# Patient Record
Sex: Female | Born: 1980 | Race: Black or African American | Hispanic: No | Marital: Married | Smoking: Former smoker
Health system: Southern US, Community
[De-identification: ages and names within clinical notes are randomized; demographics above are authoritative.]

## PROBLEM LIST (undated history)

## (undated) DIAGNOSIS — O09299 Supervision of pregnancy with other poor reproductive or obstetric history, unspecified trimester: Secondary | ICD-10-CM

## (undated) DIAGNOSIS — O009 Unspecified ectopic pregnancy without intrauterine pregnancy: Secondary | ICD-10-CM

## (undated) DIAGNOSIS — K219 Gastro-esophageal reflux disease without esophagitis: Secondary | ICD-10-CM

## (undated) DIAGNOSIS — D219 Benign neoplasm of connective and other soft tissue, unspecified: Secondary | ICD-10-CM

## (undated) DIAGNOSIS — B001 Herpesviral vesicular dermatitis: Secondary | ICD-10-CM

## (undated) DIAGNOSIS — O139 Gestational [pregnancy-induced] hypertension without significant proteinuria, unspecified trimester: Secondary | ICD-10-CM

## (undated) HISTORY — DX: Herpesviral vesicular dermatitis: B00.1

---

## 2001-04-28 ENCOUNTER — Other Ambulatory Visit: Admission: RE | Admit: 2001-04-28 | Discharge: 2001-04-28 | Payer: Self-pay | Admitting: Family Medicine

## 2003-06-14 ENCOUNTER — Other Ambulatory Visit: Admission: RE | Admit: 2003-06-14 | Discharge: 2003-06-14 | Payer: Self-pay | Admitting: Obstetrics and Gynecology

## 2006-03-30 HISTORY — PX: OTHER SURGICAL HISTORY: SHX169

## 2010-03-08 ENCOUNTER — Emergency Department (HOSPITAL_BASED_OUTPATIENT_CLINIC_OR_DEPARTMENT_OTHER)
Admission: EM | Admit: 2010-03-08 | Discharge: 2010-03-08 | Payer: Self-pay | Source: Home / Self Care | Admitting: Emergency Medicine

## 2010-03-10 ENCOUNTER — Inpatient Hospital Stay (HOSPITAL_COMMUNITY)
Admission: AD | Admit: 2010-03-10 | Discharge: 2010-03-10 | Payer: Self-pay | Source: Home / Self Care | Attending: Obstetrics & Gynecology | Admitting: Obstetrics & Gynecology

## 2010-03-14 ENCOUNTER — Inpatient Hospital Stay (HOSPITAL_COMMUNITY)
Admission: AD | Admit: 2010-03-14 | Discharge: 2010-03-14 | Payer: Self-pay | Source: Home / Self Care | Attending: Obstetrics & Gynecology | Admitting: Obstetrics & Gynecology

## 2010-03-28 ENCOUNTER — Inpatient Hospital Stay (HOSPITAL_COMMUNITY)
Admission: AD | Admit: 2010-03-28 | Discharge: 2010-03-28 | Payer: Self-pay | Source: Home / Self Care | Attending: Obstetrics and Gynecology | Admitting: Obstetrics and Gynecology

## 2010-04-02 ENCOUNTER — Ambulatory Visit: Admit: 2010-04-02 | Payer: Self-pay | Admitting: Obstetrics and Gynecology

## 2010-06-10 LAB — URINE MICROSCOPIC-ADD ON

## 2010-06-10 LAB — URINALYSIS, ROUTINE W REFLEX MICROSCOPIC
Bilirubin Urine: NEGATIVE
Glucose, UA: NEGATIVE mg/dL
Specific Gravity, Urine: 1.025 (ref 1.005–1.030)

## 2010-06-10 LAB — WET PREP, GENITAL: Trich, Wet Prep: NONE SEEN

## 2010-06-10 LAB — ABO/RH

## 2010-06-10 LAB — HCG, QUANTITATIVE, PREGNANCY
hCG, Beta Chain, Quant, S: 1255 m[IU]/mL — ABNORMAL HIGH (ref <5)
hCG, Beta Chain, Quant, S: 1895 m[IU]/mL — ABNORMAL HIGH (ref <5)

## 2011-02-05 ENCOUNTER — Ambulatory Visit (INDEPENDENT_AMBULATORY_CARE_PROVIDER_SITE_OTHER): Payer: BC Managed Care – PPO | Admitting: Internal Medicine

## 2011-02-05 ENCOUNTER — Encounter: Payer: Self-pay | Admitting: Internal Medicine

## 2011-02-05 ENCOUNTER — Other Ambulatory Visit (INDEPENDENT_AMBULATORY_CARE_PROVIDER_SITE_OTHER): Payer: BC Managed Care – PPO

## 2011-02-05 ENCOUNTER — Ambulatory Visit: Payer: Self-pay | Admitting: Internal Medicine

## 2011-02-05 VITALS — BP 124/78 | HR 78 | Temp 98.2°F | Resp 14 | Ht 66.5 in | Wt 159.8 lb

## 2011-02-05 DIAGNOSIS — Z0000 Encounter for general adult medical examination without abnormal findings: Secondary | ICD-10-CM

## 2011-02-05 LAB — COMPREHENSIVE METABOLIC PANEL
ALT: 18 U/L (ref 0–35)
Alkaline Phosphatase: 52 U/L (ref 39–117)
CO2: 27 mEq/L (ref 19–32)
Creatinine, Ser: 0.7 mg/dL (ref 0.4–1.2)
GFR: 134.89 mL/min (ref >60.00)
Glucose, Bld: 80 mg/dL (ref 70–99)
Total Bilirubin: 0.6 mg/dL (ref 0.3–1.2)

## 2011-02-05 LAB — CBC WITH DIFFERENTIAL/PLATELET
Basophils Absolute: 0.1 10*3/uL (ref 0.0–0.1)
HCT: 40.2 % (ref 36.0–46.0)
Hemoglobin: 13.4 g/dL (ref 12.0–15.0)
Lymphs Abs: 1.9 10*3/uL (ref 0.7–4.0)
MCHC: 33.4 g/dL (ref 30.0–36.0)
MCV: 93.3 fl (ref 78.0–100.0)
Monocytes Absolute: 0.4 10*3/uL (ref 0.1–1.0)
Monocytes Relative: 9.4 % (ref 3.0–12.0)
Neutro Abs: 2.3 10*3/uL (ref 1.4–7.7)
RDW: 13.6 % (ref 11.5–14.6)

## 2011-02-05 LAB — LIPID PANEL
Cholesterol: 168 mg/dL (ref 0–200)
Total CHOL/HDL Ratio: 3
Triglycerides: 57 mg/dL (ref 0.0–149.0)

## 2011-02-05 LAB — URINALYSIS, ROUTINE W REFLEX MICROSCOPIC
Leukocytes, UA: NEGATIVE
Specific Gravity, Urine: 1.015 (ref 1.000–1.030)
Urobilinogen, UA: 0.2 (ref 0.0–1.0)

## 2011-02-05 LAB — TSH: TSH: 0.6 u[IU]/mL (ref 0.35–5.50)

## 2011-02-05 NOTE — Assessment & Plan Note (Signed)
Exam done, labs ordered, pt ed material was given, she refused to get a Flu vaccine

## 2011-02-05 NOTE — Progress Notes (Signed)
  Subjective:    Patient ID: Shannon Davenport, female    DOB: 08-09-1980, 30 y.o.   MRN: [id number]  HPI New to me for a complete physical, she offers no complaints.   Review of Systems  Constitutional: Negative.   HENT: Negative.   Eyes: Negative.   Respiratory: Negative.   Cardiovascular: Negative.   Gastrointestinal: Negative.   Genitourinary: Negative.   Musculoskeletal: Negative.   Skin: Negative.   Neurological: Negative.   Hematological: Negative.   Psychiatric/Behavioral: Negative.        Objective:   Physical Exam  Vitals reviewed. Constitutional: She is oriented to person, place, and time. She appears well-developed and well-nourished. No distress.  HENT:  Head: Normocephalic and atraumatic.  Mouth/Throat: Oropharynx is clear and moist. No oropharyngeal exudate.  Eyes: Conjunctivae and EOM are normal. Pupils are equal, round, and reactive to light. Right eye exhibits no discharge. Left eye exhibits no discharge. No scleral icterus.  Neck: Normal range of motion. Neck supple. No JVD present. No tracheal deviation present. No thyromegaly present.  Cardiovascular: Normal rate, regular rhythm, normal heart sounds and intact distal pulses.  Exam reveals no gallop and no friction rub.   No murmur heard. Pulmonary/Chest: Effort normal and breath sounds normal. No stridor. No respiratory distress. She has no wheezes. She has no rales. She exhibits no tenderness.  Abdominal: Soft. Bowel sounds are normal. She exhibits no distension and no mass. There is no tenderness. There is no rebound and no guarding.  Musculoskeletal: Normal range of motion. She exhibits no edema and no tenderness.  Lymphadenopathy:    She has no cervical adenopathy.  Neurological: She is oriented to person, place, and time.  Skin: Skin is warm and dry. No rash noted. She is not diaphoretic. No erythema. No pallor.  Psychiatric: She has a normal mood and affect. Her behavior is normal. Judgment and  thought content normal.          Assessment & Plan:

## 2011-02-05 NOTE — Patient Instructions (Signed)

## 2011-02-09 ENCOUNTER — Encounter: Payer: Self-pay | Admitting: Internal Medicine

## 2011-02-18 ENCOUNTER — Encounter: Payer: Self-pay | Admitting: Internal Medicine

## 2011-02-18 ENCOUNTER — Ambulatory Visit (INDEPENDENT_AMBULATORY_CARE_PROVIDER_SITE_OTHER): Payer: BC Managed Care – PPO | Admitting: Internal Medicine

## 2011-02-18 VITALS — BP 120/80 | HR 76 | Temp 98.8°F | Ht 66.5 in | Wt 161.8 lb

## 2011-02-18 DIAGNOSIS — B009 Herpesviral infection, unspecified: Secondary | ICD-10-CM

## 2011-02-18 DIAGNOSIS — B001 Herpesviral vesicular dermatitis: Secondary | ICD-10-CM

## 2011-02-18 DIAGNOSIS — J209 Acute bronchitis, unspecified: Secondary | ICD-10-CM | POA: Insufficient documentation

## 2011-02-18 HISTORY — DX: Herpesviral vesicular dermatitis: B00.1

## 2011-02-18 MED ORDER — PSEUDOEPH-CHLORPHEN-HYDROCOD 60-4-5 MG/5ML PO SOLN: 5.0000 mL | Freq: Four times a day (QID) | ORAL | Status: DC | PRN

## 2011-02-18 MED ORDER — VALACYCLOVIR HCL 500 MG PO TABS: 500.0000 mg | ORAL_TABLET | Freq: Three times a day (TID) | ORAL | Status: DC

## 2011-02-18 MED ORDER — AZITHROMYCIN 500 MG PO TABS: 500.0000 mg | ORAL_TABLET | Freq: Every day | ORAL | Status: AC

## 2011-02-18 NOTE — Assessment & Plan Note (Signed)
Start zpak for the infection and zutripro for symptom relief

## 2011-02-18 NOTE — Assessment & Plan Note (Signed)
Start valtrex for the infection

## 2011-02-18 NOTE — Progress Notes (Signed)
  Subjective:    Patient ID: Shannon Davenport, female    DOB: April 09, 1980, 30 y.o.   MRN: [id number]  Cough This is a new problem. The current episode started in the past 7 days. The problem has been gradually worsening. The problem occurs every few hours. The cough is productive of purulent sputum. Associated symptoms include chills and a sore throat. Pertinent negatives include no chest pain, ear congestion, ear pain, eye redness, fever, headaches, heartburn, hemoptysis, myalgias, nasal congestion, postnasal drip, rash, rhinorrhea, shortness of breath, sweats, weight loss or wheezing. The symptoms are aggravated by nothing. She has tried nothing for the symptoms.  Sore Throat  Associated symptoms include coughing. Pertinent negatives include no abdominal pain, congestion, diarrhea, drooling, ear pain, headaches, shortness of breath, stridor, trouble swallowing or vomiting.      Review of Systems  Constitutional: Positive for chills. Negative for fever, weight loss, diaphoresis, activity change, appetite change, fatigue and unexpected weight change.  HENT: Positive for sore throat and mouth sores. Negative for ear pain, congestion, rhinorrhea, sneezing, drooling, trouble swallowing, voice change and postnasal drip.   Eyes: Negative for photophobia, pain, discharge, redness, itching and visual disturbance.  Respiratory: Positive for cough. Negative for hemoptysis, shortness of breath, wheezing and stridor.   Cardiovascular: Negative for chest pain, palpitations and leg swelling.  Gastrointestinal: Negative for heartburn, nausea, vomiting, abdominal pain, diarrhea, constipation and blood in stool.  Genitourinary: Negative for dysuria, urgency, frequency, hematuria, flank pain, decreased urine volume, enuresis, difficulty urinating and dyspareunia.  Musculoskeletal: Negative for myalgias, back pain, joint swelling, arthralgias and gait problem.  Skin: Negative for color change, pallor, rash and  wound.  Neurological: Negative for headaches.  Hematological: Negative for adenopathy. Does not bruise/bleed easily.  Psychiatric/Behavioral: Negative.        Objective:   Physical Exam  Vitals reviewed. Constitutional: She is oriented to person, place, and time. She appears well-developed and well-nourished. No distress.  HENT:  Head: Normocephalic and atraumatic. No trismus in the jaw.  Right Ear: Hearing, tympanic membrane, external ear and ear canal normal.  Left Ear: Hearing, tympanic membrane, external ear and ear canal normal.  Mouth/Throat: Oropharynx is clear and moist. Mucous membranes are not pale, not dry and not cyanotic. No uvula swelling. No oropharyngeal exudate, posterior oropharyngeal edema, posterior oropharyngeal erythema or tonsillar abscesses.    Eyes: Conjunctivae are normal. Right eye exhibits no discharge. Left eye exhibits no discharge. No scleral icterus.  Neck: Normal range of motion. Neck supple. No JVD present. No tracheal deviation present. No thyromegaly present.  Cardiovascular: Normal rate, regular rhythm, normal heart sounds and intact distal pulses.  Exam reveals no gallop and no friction rub.   No murmur heard. Pulmonary/Chest: Effort normal and breath sounds normal. No stridor. No respiratory distress. She has no wheezes. She has no rales. She exhibits no tenderness.  Abdominal: Soft. Bowel sounds are normal. She exhibits no distension and no mass. There is no tenderness. There is no rebound and no guarding.  Musculoskeletal: Normal range of motion. She exhibits no edema and no tenderness.  Lymphadenopathy:    She has no cervical adenopathy.  Neurological: She is oriented to person, place, and time.  Skin: Skin is warm and dry. No rash noted. She is not diaphoretic. There is erythema. No pallor.  Psychiatric: She has a normal mood and affect. Her behavior is normal. Judgment and thought content normal.          Assessment & Plan:

## 2011-02-18 NOTE — Patient Instructions (Signed)
Fever Blisters, Herpes Simplex Herpes simplex is a virus. This virus causes fever blisters or cold sores. Fever blisters are small sores on the lips, gums, or roof of the mouth. People often get infected with this herpes virus but do not have any symptoms. The blisters may break out when a person is:  Tired.   Under stress.   Suffering from another infection (such as a cold).   Exposed to sunlight.  The blisters usually heal within 1 week. The virus can be easily passed to other people and to other parts of the body, such as the eyes and sex organs. CAUSES  A virus, herpes simplex, is the cause of fever blisters. This virus can be passed (transmitted) from person to person and is therefore contagious. There are 2 types of herpes simplex virus. Type 1 usually causes oral herpes or fever blisters. Type 2 usually causes genital herpes. Both viruses do have the potential to cause oral and genital infections. However, the type 1 virus causes more than 90% of recurrent fever blister outbreaks.  Herpes simplex virus is highly contagious when fever blisters are present. Close contact, including kissing, can spread the virus. Children often become infected by contact with others who have fever blisters. A child can spread the virus by rubbing the cold sore and touching other children or when other children touch clothing, wipes, or toys contaminated by an infected child with the virus. In adults, about 10% of oral herpes infections are from oral-genital sex with a person who has active genital herpes (type 2).  Type 1 herpes infection is very common, eventually occurring in up to 8 out of 10 otherwise healthy people. Most people become infected before they are 30 years old. The virus usually infects the lips, throat, or mouth. Initial infection in children can be extensive with many lesions throughout the mouth. In adults, the first infection may cause no symptoms. Some adults may develop many fluid-filled  blisters inside and outside the mouth 3 to 5 days after they are initially infected but severe infection is uncommon. Fever, swollen neck glands, and general aches may occur but this is also uncommon. The blisters tend to come together and then collapse. When on the lip, a yellowish crust forms over the sores. Healing of the area without scarring typically occurs within 2 weeks. Once a person is infected, the herpes virus permanently remains alive in the body within a nerve near the cheekbone. It then stays inactive at this site, only to sometimes travel down the nerve to the skin. This causes a recurrence of fever blisters. Recurrent blisters usually break out at the outside edge of the lip or edge of the nostril. Recurrent fever blisters may occasionally occur on the chin, cheeks, or inside the mouth. Recurrent fever blister attacks are usually not as painful and not as numerous as the first infection. Recurrences are less frequent after age 35. Many people who have recurring fever blisters feel itching, tingling, or burning at the lip border. This can occur hours or a couple days before the blister appears.  Factors which weaken the body's immune system may trigger an outbreak or recurrence of herpes. These include some drugs (such as steroids), emotional stress, fever, illness, sleep deprivation, and other injuries. Sunlight may also trigger an outbreak. Many women have recurrences only during their menstrual period.  TREATMENT There is no cure for fever blisters. There is no vaccine for herpes simplex virus.  Certain medicines can relieve some of the pain   and discomfort of the sores or promote more rapid healing. These include ointments that numb the blisters and medicines that control bacterial infections (antibiotics). A number of drugs active against herpes viruses (antivirals), either applied locally as a gel or cream, or taken in pill form, may promote healing by keeping the virus from multiplying  and infecting more local tissue.   Keep fever blisters clean and dry. This helps to prevent bacterial invasion of the virally infected tissues.   Eat a soft, bland diet to avoid irritating the sores.   Be careful not to touch the sores and spread the virus to new sites, such as:   Other areas of the face.   Eyes.   Genitals.   Make sure you do not infect others. Avoid kissing people when a fever blister is present. Avoid touching the sores and then touching others.   Sunscreen on the lips can prevent recurrences if outbreaks are triggered by sunlight. The sunscreen should be put on before going outside and reapplied often while in the sun.   Avoid stress if this seems to cause outbreaks.  HOME CARE INSTRUCTIONS   Only take over-the-counter or prescription medicines for pain, discomfort, or fever as directed by your caregiver. Do not use aspirin.   Do not touch the blisters or pick the scabs. Wash your hands often. Do not touch your eyes without washing your hands first.   Avoid close contact with other people, especially kissing, until blisters heal.   Hot, cold, or salty foods may hurt your mouth. Use a straw to drink. Eating a well-balanced diet will help healing.  SEEK MEDICAL CARE IF:   Your eye feels irritated, painful, or you feel like you have something in your eye.   You develop a fever, feel achy, or see pus instead of clear fluid in the sores. These are signs of a bacterial infection.   You get blisters on your genitals.   You develop new, unexplained symptoms.  MAKE SURE YOU:   Understand these instructions.   Will watch your condition.   Will get help right away if you are not doing well or get worse.  Document Released: 03/16/2005 Document Revised: 11/26/2010 Document Reviewed: 07/21/2007 [facility] Patient Information 2012 [place], [place].Acute Bronchitis You have acute bronchitis. This means you have a chest cold. The airways in your lungs are red and sore  (inflamed). Acute means it is sudden onset.  CAUSES Bronchitis is most often caused by the same virus that causes a cold. SYMPTOMS   Body aches.   Chest congestion.   Chills.   Cough.   Fever.   Shortness of breath.   Sore throat.  TREATMENT  Acute bronchitis is usually treated with rest, fluids, and medicines for relief of fever or cough. Most symptoms should go away after a few days or a week. Increased fluids may help thin your secretions and will prevent dehydration. Your caregiver may give you an inhaler to improve your symptoms. The inhaler reduces shortness of breath and helps control cough. You can take over-the-counter pain relievers or cough medicine to decrease coughing, pain, or fever. A cool-air vaporizer may help thin bronchial secretions and make it easier to clear your chest. Antibiotics are usually not needed but can be prescribed if you smoke, are seriously ill, have chronic lung problems, are elderly, or you are at higher risk for developing complications.Allergies and asthma can make bronchitis worse. Repeated episodes of bronchitis may cause longstanding lung problems. Avoid smoking and secondhand  smoke.Exposure to cigarette smoke or irritating chemicals will make bronchitis worse. If you are a cigarette smoker, consider using nicotine gum or skin patches to help control withdrawal symptoms. Quitting smoking will help your lungs heal faster. Recovery from bronchitis is often slow, but you should start feeling better after 2 to 3 days. Cough from bronchitis frequently lasts for 3 to 4 weeks. To prevent another bout of acute bronchitis:  Quit smoking.   Wash your hands frequently to get rid of viruses or use a hand sanitizer.   Avoid other people with cold or virus symptoms.   Try not to touch your hands to your mouth, nose, or eyes.  SEEK IMMEDIATE MEDICAL CARE IF:  You develop increased fever, chills, or chest pain.   You have severe shortness of breath or  bloody sputum.   You develop dehydration, fainting, repeated vomiting, or a severe headache.   You have no improvement after 1 week of treatment or you get worse.  MAKE SURE YOU:   Understand these instructions.   Will watch your condition.   Will get help right away if you are not doing well or get worse.  Document Released: 04/23/2004 Document Revised: 11/26/2010 Document Reviewed: 07/09/2010 [facility] Patient Information 2012 [place], [place].

## 2011-05-14 ENCOUNTER — Ambulatory Visit: Payer: BC Managed Care – PPO | Admitting: Internal Medicine

## 2011-05-14 DIAGNOSIS — Z0289 Encounter for other administrative examinations: Secondary | ICD-10-CM

## 2011-05-20 ENCOUNTER — Encounter: Payer: Self-pay | Admitting: Internal Medicine

## 2011-05-20 ENCOUNTER — Ambulatory Visit (INDEPENDENT_AMBULATORY_CARE_PROVIDER_SITE_OTHER): Payer: BC Managed Care – PPO | Admitting: Internal Medicine

## 2011-05-20 VITALS — BP 130/80 | HR 80 | Temp 97.4°F | Ht 66.5 in | Wt 162.0 lb

## 2011-05-20 DIAGNOSIS — J019 Acute sinusitis, unspecified: Secondary | ICD-10-CM

## 2011-05-20 DIAGNOSIS — J209 Acute bronchitis, unspecified: Secondary | ICD-10-CM

## 2011-05-20 MED ORDER — LEVOFLOXACIN 250 MG PO TABS: 250.0000 mg | ORAL_TABLET | Freq: Every day | ORAL | Status: AC

## 2011-05-20 MED ORDER — PSEUDOEPH-CHLORPHEN-HYDROCOD 60-4-5 MG/5ML PO SOLN: 5.0000 mL | Freq: Four times a day (QID) | ORAL | Status: DC | PRN

## 2011-05-20 NOTE — Progress Notes (Signed)
  Subjective:    Patient ID: Shannon Davenport, female    DOB: 05/02/1980, 30 y.o.   MRN: [id number]  HPI   Here with 2 wks acute onset fever, facial pain, pressure, general weakness and malaise, and greenish d/c, with slight ST, but little to no cough and Pt denies chest pain, increased sob or doe, wheezing, orthopnea, PND, increased LE swelling, palpitations, dizziness or syncope.   Pt denies polydipsia, polyuria. Not pregnant - home preg test neg 3 days ago. Past Medical History  Diagnosis Date  . 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  reports that she has been smoking Cigarettes.  She has a 1.75 pack-year smoking history. She has never used smokeless tobacco. She reports that she drinks about 3 ounces of alcohol per week. She reports that she does not use illicit drugs. family history includes Heart disease in her mother; Hyperlipidemia in her mother; and Hypertension in her mother.  There is no history of Cancer. No Known Allergies Current Outpatient Prescriptions on File Prior to Visit  Medication Sig Dispense Refill  . Multiple Vitamin (MULTIVITAMIN) tablet Take 1 tablet by mouth daily.         Review of Systems All otherwise neg per pt     Objective:   Physical Exam BP 130/80  Pulse 80  Temp(Src) 97.4 F (36.3 C) (Oral)  Ht 5' 6.5" (1.689 m)  Wt 162 lb (73.483 kg)  BMI 25.76 kg/m2  SpO2 96% Physical Exam  VS noted, mild ill Constitutional: Pt appears well-developed and well-nourished.  HENT: Head: Normocephalic.  Right Ear: External ear normal.  Left Ear: External ear normal.  Bilat tm's mild erythema.  Sinus tender bilat right max > left.  Pharynx mild erythema Eyes: Conjunctivae and EOM are normal. Pupils are equal, round, and reactive to light.  Neck: Normal range of motion. Neck supple.  Cardiovascular: Normal rate and regular rhythm.   Pulmonary/Chest: Effort normal and breath sounds normal.  Neurological: Pt is alert. No cranial  nerve deficit.     Assessment & Plan:

## 2011-05-20 NOTE — Assessment & Plan Note (Signed)
Mild to mod, for antibx course,  to f/u any worsening symptoms or concerns 

## 2011-05-20 NOTE — Patient Instructions (Addendum)
Take all new medications as prescribed Continue all other medications as before You can also take  Mucinex (or it's generic off brand) for congestion  

## 2012-02-02 ENCOUNTER — Ambulatory Visit (INDEPENDENT_AMBULATORY_CARE_PROVIDER_SITE_OTHER): Payer: BC Managed Care – PPO | Admitting: Internal Medicine

## 2012-02-02 ENCOUNTER — Encounter: Payer: Self-pay | Admitting: Internal Medicine

## 2012-02-02 VITALS — BP 130/82 | HR 83 | Temp 98.0°F | Resp 16 | Ht 67.0 in | Wt 162.5 lb

## 2012-02-02 DIAGNOSIS — J4530 Mild persistent asthma, uncomplicated: Secondary | ICD-10-CM | POA: Insufficient documentation

## 2012-02-02 DIAGNOSIS — J209 Acute bronchitis, unspecified: Secondary | ICD-10-CM

## 2012-02-02 DIAGNOSIS — J309 Allergic rhinitis, unspecified: Secondary | ICD-10-CM | POA: Insufficient documentation

## 2012-02-02 DIAGNOSIS — B001 Herpesviral vesicular dermatitis: Secondary | ICD-10-CM

## 2012-02-02 DIAGNOSIS — J45909 Unspecified asthma, uncomplicated: Secondary | ICD-10-CM

## 2012-02-02 MED ORDER — MOMETASONE FUROATE 50 MCG/ACT NA SUSP: 2.0000 | Freq: Every day | NASAL | Status: DC

## 2012-02-02 MED ORDER — MOMETASONE FURO-FORMOTEROL FUM 200-5 MCG/ACT IN AERO: 2.0000 | INHALATION_SPRAY | Freq: Two times a day (BID) | RESPIRATORY_TRACT | Status: DC

## 2012-02-02 MED ORDER — VALACYCLOVIR HCL 500 MG PO TABS: 500.0000 mg | ORAL_TABLET | Freq: Three times a day (TID) | ORAL | Status: DC

## 2012-02-02 MED ORDER — PSEUDOEPH-CHLORPHEN-HYDROCOD 60-4-5 MG/5ML PO SOLN: 5.0000 mL | Freq: Four times a day (QID) | ORAL | Status: DC | PRN

## 2012-02-02 NOTE — Progress Notes (Signed)
Subjective:    Patient ID: Shannon Davenport, female    DOB: Mar 11, 1981, 31 y.o.   MRN: [id number]  Cough This is a chronic problem. The current episode started more than 1 year ago. The problem has been unchanged. The problem occurs every few hours. The cough is non-productive. Associated symptoms include nasal congestion, postnasal drip and rhinorrhea. Pertinent negatives include no chest pain, chills, ear congestion, ear pain, fever, headaches, heartburn, hemoptysis, myalgias, rash, sore throat, shortness of breath, sweats, weight loss or wheezing. The symptoms are aggravated by cold air and dust. She has tried prescription cough suppressant for the symptoms. The treatment provided significant relief. Her past medical history is significant for environmental allergies. There is no history of asthma, bronchitis or pneumonia.      Review of Systems  Constitutional: Negative for fever, chills, weight loss, diaphoresis, activity change, appetite change, fatigue and unexpected weight change.  HENT: Positive for congestion, rhinorrhea, sneezing and postnasal drip. Negative for ear pain, nosebleeds, sore throat, facial swelling, trouble swallowing, voice change and sinus pressure.   Eyes: Negative.   Respiratory: Positive for cough. Negative for apnea, hemoptysis, choking, chest tightness, shortness of breath, wheezing and stridor.   Cardiovascular: Negative for chest pain, palpitations and leg swelling.  Gastrointestinal: Negative for heartburn, nausea, vomiting, abdominal pain, diarrhea and constipation.  Genitourinary: Negative.   Musculoskeletal: Negative for myalgias, back pain, joint swelling, arthralgias and gait problem.  Skin: Negative for color change, pallor, rash and wound.  Neurological: Negative.  Negative for headaches.  Hematological: Positive for environmental allergies. Negative for adenopathy. Does not bruise/bleed easily.  Psychiatric/Behavioral: Negative.        Objective:     Physical Exam  Vitals reviewed. Constitutional: She is oriented to person, place, and time. She appears well-developed and well-nourished.  Non-toxic appearance. She does not have a sickly appearance. She does not appear ill. No distress.  HENT:  Head: Normocephalic and atraumatic. No trismus in the jaw.  Right Ear: Hearing, tympanic membrane, external ear and ear canal normal.  Left Ear: Hearing, tympanic membrane, external ear and ear canal normal.  Nose: Mucosal edema present. No rhinorrhea, sinus tenderness, nasal deformity or septal deviation. No epistaxis.  No foreign bodies. Right sinus exhibits no maxillary sinus tenderness and no frontal sinus tenderness. Left sinus exhibits no maxillary sinus tenderness and no frontal sinus tenderness.  Mouth/Throat: Oropharynx is clear and moist. Mucous membranes are not pale, not dry and not cyanotic. No oral lesions. No uvula swelling. No oropharyngeal exudate, posterior oropharyngeal edema, posterior oropharyngeal erythema or tonsillar abscesses.  Eyes: Conjunctivae normal are normal. Right eye exhibits no discharge. Left eye exhibits no discharge. No scleral icterus.  Neck: Normal range of motion. Neck supple. No JVD present. No tracheal deviation present. No thyromegaly present.  Cardiovascular: Normal rate, regular rhythm, normal heart sounds and intact distal pulses.  Exam reveals no gallop and no friction rub.   No murmur heard. Pulmonary/Chest: Effort normal and breath sounds normal. No accessory muscle usage or stridor. Not tachypneic. No respiratory distress. She has no decreased breath sounds. She has no wheezes. She has no rhonchi. She has no rales. She exhibits no tenderness.  Abdominal: Soft. Bowel sounds are normal. She exhibits no distension and no mass. There is no tenderness. There is no rebound and no guarding.  Musculoskeletal: Normal range of motion. She exhibits no edema and no tenderness.  Lymphadenopathy:    She has no cervical  adenopathy.  Neurological: She is oriented to person,  place, and time.  Skin: Skin is warm and dry. No rash noted. She is not diaphoretic. No erythema. No pallor.  Psychiatric: She has a normal mood and affect. Her behavior is normal. Judgment and thought content normal.      Lab Results  Component Value Date   WBC 4.8 02/05/2011   HGB 13.4 02/05/2011   HCT 40.2 02/05/2011   PLT 221.0 02/05/2011   GLUCOSE 80 02/05/2011   CHOL 168 02/05/2011   TRIG 57.0 02/05/2011   HDL 61.30 02/05/2011   LDLCALC 95 02/05/2011   ALT 18 02/05/2011   AST 26 02/05/2011   NA 138 02/05/2011   K 3.8 02/05/2011   CL 103 02/05/2011   CREATININE 0.7 02/05/2011   BUN 15 02/05/2011   CO2 27 02/05/2011   TSH 0.60 02/05/2011      Assessment & Plan:

## 2012-02-02 NOTE — Patient Instructions (Signed)

## 2012-02-03 ENCOUNTER — Encounter: Payer: Self-pay | Admitting: Internal Medicine

## 2012-02-03 NOTE — Assessment & Plan Note (Signed)
Start nasonex ns 

## 2012-02-03 NOTE — Assessment & Plan Note (Addendum)
Start dulera and use cough suppressant as needed

## 2012-03-07 ENCOUNTER — Ambulatory Visit: Payer: BC Managed Care – PPO | Admitting: Internal Medicine

## 2012-03-07 DIAGNOSIS — Z0289 Encounter for other administrative examinations: Secondary | ICD-10-CM

## 2012-05-11 ENCOUNTER — Ambulatory Visit: Payer: BC Managed Care – PPO | Admitting: Internal Medicine

## 2012-05-13 ENCOUNTER — Ambulatory Visit: Payer: BC Managed Care – PPO | Admitting: Internal Medicine

## 2012-05-27 ENCOUNTER — Ambulatory Visit (INDEPENDENT_AMBULATORY_CARE_PROVIDER_SITE_OTHER): Payer: BC Managed Care – PPO | Admitting: Internal Medicine

## 2012-05-27 ENCOUNTER — Encounter: Payer: Self-pay | Admitting: Internal Medicine

## 2012-05-27 VITALS — BP 130/82 | HR 77 | Temp 98.1°F | Ht 67.0 in | Wt 164.2 lb

## 2012-05-27 DIAGNOSIS — B001 Herpesviral vesicular dermatitis: Secondary | ICD-10-CM

## 2012-05-27 DIAGNOSIS — B009 Herpesviral infection, unspecified: Secondary | ICD-10-CM

## 2012-05-27 DIAGNOSIS — J309 Allergic rhinitis, unspecified: Secondary | ICD-10-CM

## 2012-05-27 DIAGNOSIS — M7710 Lateral epicondylitis, unspecified elbow: Secondary | ICD-10-CM

## 2012-05-27 MED ORDER — VALACYCLOVIR HCL 500 MG PO TABS: 500.0000 mg | ORAL_TABLET | Freq: Three times a day (TID) | ORAL | Status: AC

## 2012-05-27 MED ORDER — PSEUDOEPH-CHLORPHEN-HYDROCOD 60-4-5 MG/5ML PO SOLN: 5.0000 mL | Freq: Four times a day (QID) | ORAL | Status: DC | PRN

## 2012-05-27 NOTE — Progress Notes (Signed)
Subjective:    Patient ID: Shannon Davenport, female    DOB: May 19, 1980, 32 y.o.   MRN: [id number]  HPI  Pt presents to the clinic today with c/o elbow pain. This started about 1 week ago.Both of her elbows hurt. She denies having an injury to them. She does do push up's daily. She has been taking Advil and icing them which helps relieve the pain. She does deny any swelling of the joints. She has never had problems with her elbows in the past. Additionally, she does c/o of a chronic cough. She has seen Dr.Jones for this in the past who prescribed Dulera, Nasonex and a cough syrup for her. She does not want to take the inhaler or nasal spray. She does not think she has allergies or asthma. She would like a refill of the cough syrup. Pt would also like a refill of her Valtrex. She is not having any symptoms at this time.  Review of Systems      Past Medical History  Diagnosis Date  . 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 mometasone (NASONEX) 50 MCG/ACT nasal spray Place 2 sprays into the nose daily.  17 g  12  . mometasone-formoterol (DULERA) 200-5 MCG/ACT AERO Inhale 2 puffs into the lungs 2 (two) times daily.  13 g  11  . Multiple Vitamin (MULTIVITAMIN) tablet Take 1 tablet by mouth daily.        . Pseudoeph-Chlorphen-Hydrocod (ZUTRIPRO) 60-4-5 MG/5ML SOLN Take 5 mLs by mouth 4 (four) times daily as needed.  240 mL  0   No current facility-administered medications for this visit.    No Known Allergies  Family History  Problem Relation Age of Onset  . Heart disease Mother   . Hyperlipidemia Mother   . Hypertension Mother   . Cancer Neg Hx     History   Social History  . Marital Status: Married    Spouse Name: N/A    Number of Children: N/A  . Years of Education: N/A   Occupational History  . Not on file.   Social History Main Topics  . Smoking status: Current Some Day Smoker -- 0.25 packs/day for 7  years    Types: Cigarettes  . Smokeless tobacco: Never Used  . Alcohol Use: 3.0 oz/week    5 Glasses of wine per week  . Drug Use: No  . Sexually Active: Yes    Birth Control/ Protection: Condom   Other Topics Concern  . Not on file   Social History Narrative  . No narrative on file     Constitutional: Denies fever, malaise, fatigue, headache or abrupt weight changes.  HEENT: Pt reports post nasal drip. Denies eye pain, eye redness, ear pain, ringing in the ears, wax buildup, runny nose, nasal congestion, bloody nose, or sore throat. Respiratory: Pt reports cough. Denies difficulty breathing, shortness of breath, cough or sputum production.   Musculoskeletal: Pt reports bilateral elbow pain. Denies decrease in range of motion, difficulty with gait, muscle pain or joint pain and swelling.    No other specific complaints in a complete review of systems (except as listed in HPI above). .  Objective:   Physical Exam   BP 130/82  Pulse 77  Temp(Src) 98.1 F (36.7 C) (Oral)  Ht 5\' 7"  (1.702 m)  Wt 164 lb 3.2 oz (74.481 kg)  BMI 25.71 kg/m2  SpO2 97%  LMP 04/16/2012 Wt Readings from Last 3  Encounters:  05/27/12 164 lb 3.2 oz (74.481 kg)  02/02/12 162 lb 8 oz (73.71 kg)  05/20/11 162 lb (73.483 kg)    General: Appears her stated age, well developed, well nourished in NAD. HEENT: Head: normal shape and size; Eyes: sclera white, no icterus, conjunctiva pink, PERRLA and EOMs intact; Ears: Tm's gray and intact, normal light reflex; Nose: mucosa pink and moist, septum midline; Throat/Mouth: Teeth present, mucosa pink and moist, + PND, no exudate, lesions or ulcerations noted.  Cardiovascular: Normal rate and rhythm. S1,S2 noted.  No murmur, rubs or gallops noted. No JVD or BLE edema. No carotid bruits noted. Pulmonary/Chest: Normal effort and positive vesicular breath sounds. No respiratory distress. No wheezes, rales or ronchi noted.  Musculoskeletal: Pinpoint tenderness over the  lateral epicondyles. Normal range of motion. No signs of joint swelling. No difficulty with gait.       Assessment & Plan:   Tennis elbow, bilateral, new onset, no additional workup required:  I would stop doing pushups for the next week Continue the Advil and Ice Information given on RICE for injuries  Allergic Rhinitis, new, no additional workup required:  Refilled cough syrup Pt would benefit from daily Zyrtec- pt declined at this time  Herpes Simplex Labialis:  Refilled Valtrex today  RTC as needed or if symptoms persist

## 2012-05-27 NOTE — Patient Instructions (Signed)
Lateral Epicondylitis (Tennis Elbow) with Rehab Lateral epicondylitis involves inflammation and pain around the outer portion of the elbow. The pain is caused by inflammation of the tendons in the forearm that bring back (extend) the wrist. Lateral epicondylittis is also called tennis elbow, because it is very common in tennis players. However, it may occur in any individual who extends the wrist repetitively. If lateral epicondylitis is left untreated, it may become a chronic problem. SYMPTOMS   Pain, tenderness, and inflammation on the outer (lateral) side of the elbow.  Pain or weakness with gripping activities.  Pain that increases with wrist twisting motions (playing tennis, using a screwdriver, opening a door or a jar).  Pain with lifting objects, including a coffee cup. CAUSES  Lateral epicondylitis is caused by inflammation of the tendons that extend the wrist. Causes of injury may include:  Repetitive stress and strain on the muscles and tendons that extend the wrist.  Sudden change in activity level or intensity.  Incorrect grip in racquet sports.  Incorrect grip size of racquet (often too large).  Incorrect hitting position or technique (usually backhand, leading with the elbow).  Using a racket that is too heavy. RISK INCREASES WITH:  Sports or occupations that require repetitive and/or strenuous forearm and wrist movements (tennis, squash, racquetball, carpentry).  Poor wrist and forearm strength and flexibility.  Failure to warm up properly before activity.  Resuming activity before healing, rehabilitation, and conditioning are complete. PREVENTION   Warm up and stretch properly before activity.  Maintain physical fitness:  Strength, flexibility, and endurance.  Cardiovascular fitness.  Wear and use properly fitted equipment.  Learn and use proper technique and have a coach correct improper technique.  Wear a tennis elbow (counterforce) brace. PROGNOSIS   The course of this condition depends on the degree of the injury. If treated properly, acute cases (symptoms lasting less than 4 weeks) are often resolved in 2 to 6 weeks. Chronic (longer lasting cases) often resolve in 3 to 6 months, but may require physical therapy. RELATED COMPLICATIONS   Frequently recurring symptoms, resulting in a chronic problem. Properly treating the problem the first time decreases frequency of recurrence.  Chronic inflammation, scarring tendon degeneration, and partial tendon tear, requiring surgery.  Delayed healing or resolution of symptoms. TREATMENT  Treatment first involves the use of ice and medicine, to reduce pain and inflammation. Strengthening and stretching exercises may help reduce discomfort, if performed regularly. These exercises may be performed at home, if the condition is an acute injury. Chronic cases may require a referral to a physical therapist for evaluation and treatment. Your caregiver may advise a corticosteroid injection, to help reduce inflammation. Rarely, surgery is needed. MEDICATION  If pain medicine is needed, nonsteroidal anti-inflammatory medicines (aspirin and ibuprofen), or other minor pain relievers (acetaminophen), are often advised.  Do not take pain medicine for 7 days before surgery.  Prescription pain relievers may be given, if your caregiver thinks they are needed. Use only as directed and only as much as you need.  Corticosteroid injections may be recommended. These injections should be reserved only for the most severe cases, because they can only be given a certain number of times. HEAT AND COLD  Cold treatment (icing) should be applied for 10 to 15 minutes every 2 to 3 hours for inflammation and pain, and immediately after activity that aggravates your symptoms. Use ice packs or an ice massage.  Heat treatment may be used before performing stretching and strengthening activities prescribed by your   caregiver, physical  therapist, or athletic trainer. Use a heat pack or a warm water soak. SEEK MEDICAL CARE IF: Symptoms get worse or do not improve in 2 weeks, despite treatment. EXERCISES  RANGE OF MOTION (ROM) AND STRETCHING EXERCISES - Epicondylitis, Lateral (Tennis Elbow) These exercises may help you when beginning to rehabilitate your injury. Your symptoms may go away with or without further involvement from your physician, physical therapist or athletic trainer. While completing these exercises, remember:   Restoring tissue flexibility helps normal motion to return to the joints. This allows healthier, less painful movement and activity.  An effective stretch should be held for at least 30 seconds.  A stretch should never be painful. You should only feel a gentle lengthening or release in the stretched tissue. RANGE OF MOTION  Wrist Flexion, Active-Assisted  Extend your right / left elbow with your fingers pointing down.*  Gently pull the back of your hand towards you, until you feel a gentle stretch on the top of your forearm.  Hold this position for __________ seconds. Repeat __________ times. Complete this exercise __________ times per day.  *If directed by your physician, physical therapist or athletic trainer, complete this stretch with your elbow bent, rather than extended. RANGE OF MOTION  Wrist Extension, Active-Assisted  Extend your right / left elbow and turn your palm upwards.*  Gently pull your palm and fingertips back, so your wrist extends and your fingers point more toward the ground.  You should feel a gentle stretch on the inside of your forearm.  Hold this position for __________ seconds. Repeat __________ times. Complete this exercise __________ times per day. *If directed by your physician, physical therapist or athletic trainer, complete this stretch with your elbow bent, rather than extended. STRETCH - Wrist Flexion  Place the back of your right / left hand on a tabletop,  leaving your elbow slightly bent. Your fingers should point away from your body.  Gently press the back of your hand down onto the table by straightening your elbow. You should feel a stretch on the top of your forearm.  Hold this position for __________ seconds. Repeat __________ times. Complete this stretch __________ times per day.  STRETCH  Wrist Extension   Place your right / left fingertips on a tabletop, leaving your elbow slightly bent. Your fingers should point backwards.  Gently press your fingers and palm down onto the table by straightening your elbow. You should feel a stretch on the inside of your forearm.  Hold this position for __________ seconds. Repeat __________ times. Complete this stretch __________ times per day.  STRENGTHENING EXERCISES - Epicondylitis, Lateral (Tennis Elbow) These exercises may help you when beginning to rehabilitate your injury. They may resolve your symptoms with or without further involvement from your physician, physical therapist or athletic trainer. While completing these exercises, remember:   Muscles can gain both the endurance and the strength needed for everyday activities through controlled exercises.  Complete these exercises as instructed by your physician, physical therapist or athletic trainer. Increase the resistance and repetitions only as guided.  You may experience muscle soreness or fatigue, but the pain or discomfort you are trying to eliminate should never worsen during these exercises. If this pain does get worse, stop and make sure you are following the directions exactly. If the pain is still present after adjustments, discontinue the exercise until you can discuss the trouble with your caregiver. STRENGTH Wrist Flexors  Sit with your right / left forearm palm-up and   fully supported on a table or countertop. Your elbow should be resting below the height of your shoulder. Allow your wrist to extend over the edge of the  surface.  Loosely holding a __________ weight, or a piece of rubber exercise band or tubing, slowly curl your hand up toward your forearm.  Hold this position for __________ seconds. Slowly lower the wrist back to the starting position in a controlled manner. Repeat __________ times. Complete this exercise __________ times per day.  STRENGTH  Wrist Extensors  Sit with your right / left forearm palm-down and fully supported on a table or countertop. Your elbow should be resting below the height of your shoulder. Allow your wrist to extend over the edge of the surface.  Loosely holding a __________ weight, or a piece of rubber exercise band or tubing, slowly curl your hand up toward your forearm.  Hold this position for __________ seconds. Slowly lower the wrist back to the starting position in a controlled manner. Repeat __________ times. Complete this exercise __________ times per day.  STRENGTH - Ulnar Deviators  Stand with a ____________________ weight in your right / left hand, or sit while holding a rubber exercise band or tubing, with your healthy arm supported on a table or countertop.  Move your wrist, so that your pinkie travels toward your forearm and your thumb moves away from your forearm.  Hold this position for __________ seconds and then slowly lower the wrist back to the starting position. Repeat __________ times. Complete this exercise __________ times per day STRENGTH - Radial Deviators  Stand with a ____________________ weight in your right / left hand, or sit while holding a rubber exercise band or tubing, with your injured arm supported on a table or countertop.  Raise your hand upward in front of you or pull up on the rubber tubing.  Hold this position for __________ seconds and then slowly lower the wrist back to the starting position. Repeat __________ times. Complete this exercise __________ times per day. STRENGTH  Forearm Supinators   Sit with your right /  left forearm supported on a table, keeping your elbow below shoulder height. Rest your hand over the edge, palm down.  Gently grip a hammer or a soup ladle.  Without moving your elbow, slowly turn your palm and hand upward to a "thumbs-up" position.  Hold this position for __________ seconds. Slowly return to the starting position. Repeat __________ times. Complete this exercise __________ times per day.  STRENGTH  Forearm Pronators   Sit with your right / left forearm supported on a table, keeping your elbow below shoulder height. Rest your hand over the edge, palm up.  Gently grip a hammer or a soup ladle.  Without moving your elbow, slowly turn your palm and hand upward to a "thumbs-up" position.  Hold this position for __________ seconds. Slowly return to the starting position. Repeat __________ times. Complete this exercise __________ times per day.  STRENGTH - Grip  Grasp a tennis ball, a dense sponge, or a large, rolled sock in your hand.  Squeeze as hard as you can, without increasing any pain.  Hold this position for __________ seconds. Release your grip slowly. Repeat __________ times. Complete this exercise __________ times per day.  STRENGTH - Elbow Extensors, Isometric  Stand or sit upright, on a firm surface. Place your right / left arm so that your palm faces your stomach, and it is at the height of your waist.  Place your opposite hand on   the underside of your forearm. Gently push up as your right / left arm resists. Push as hard as you can with both arms, without causing any pain or movement at your right / left elbow. Hold this stationary position for __________ seconds. Gradually release the tension in both arms. Allow your muscles to relax completely before repeating. Document Released: 03/16/2005 Document Revised: 06/08/2011 Document Reviewed: 06/28/2008 Exit[employer] Patient Information 2013 [employer].  

## 2012-06-06 ENCOUNTER — Ambulatory Visit: Payer: BC Managed Care – PPO | Admitting: Sports Medicine

## 2012-07-13 ENCOUNTER — Ambulatory Visit: Payer: BC Managed Care – PPO | Admitting: Family Medicine

## 2012-11-15 ENCOUNTER — Ambulatory Visit (INDEPENDENT_AMBULATORY_CARE_PROVIDER_SITE_OTHER): Payer: BC Managed Care – PPO | Admitting: Internal Medicine

## 2012-11-15 ENCOUNTER — Encounter: Payer: Self-pay | Admitting: Internal Medicine

## 2012-11-15 VITALS — BP 122/80 | HR 75 | Temp 98.6°F | Resp 12 | Wt 170.0 lb

## 2012-11-15 DIAGNOSIS — K219 Gastro-esophageal reflux disease without esophagitis: Secondary | ICD-10-CM

## 2012-11-15 MED ORDER — PANTOPRAZOLE SODIUM 40 MG PO TBEC: 40.0000 mg | DELAYED_RELEASE_TABLET | Freq: Every day | ORAL | Status: DC

## 2012-11-15 NOTE — Patient Instructions (Signed)
Gastroesophageal Reflux Disease, Adult  Gastroesophageal reflux disease (GERD) happens when acid from your stomach flows up into the esophagus. When acid comes in contact with the esophagus, the acid causes soreness (inflammation) in the esophagus. Over time, GERD may create small holes (ulcers) in the lining of the esophagus.  CAUSES   · Increased body weight. This puts pressure on the stomach, making acid rise from the stomach into the esophagus.  · Smoking. This increases acid production in the stomach.  · Drinking alcohol. This causes decreased pressure in the lower esophageal sphincter (valve or ring of muscle between the esophagus and stomach), allowing acid from the stomach into the esophagus.  · Late evening meals and a full stomach. This increases pressure and acid production in the stomach.  · A malformed lower esophageal sphincter.  Sometimes, no cause is found.  SYMPTOMS   · Burning pain in the lower part of the mid-chest behind the breastbone and in the mid-stomach area. This may occur twice a week or more often.  · Trouble swallowing.  · Sore throat.  · Dry cough.  · Asthma-like symptoms including chest tightness, shortness of breath, or wheezing.  DIAGNOSIS   Your caregiver may be able to diagnose GERD based on your symptoms. In some cases, X-rays and other tests may be done to check for complications or to check the condition of your stomach and esophagus.  TREATMENT   Your caregiver may recommend over-the-counter or prescription medicines to help decrease acid production. Ask your caregiver before starting or adding any new medicines.   HOME CARE INSTRUCTIONS   · Change the factors that you can control. Ask your caregiver for guidance concerning weight loss, quitting smoking, and alcohol consumption.  · Avoid foods and drinks that make your symptoms worse, such as:  · Caffeine or alcoholic drinks.  · Chocolate.  · Peppermint or mint flavorings.  · Garlic and onions.  · Spicy foods.  · Citrus fruits,  such as oranges, lemons, or limes.  · Tomato-based foods such as sauce, chili, salsa, and pizza.  · Fried and fatty foods.  · Avoid lying down for the 3 hours prior to your bedtime or prior to taking a nap.  · Eat small, frequent meals instead of large meals.  · Wear loose-fitting clothing. Do not wear anything tight around your waist that causes pressure on your stomach.  · Raise the head of your bed 6 to 8 inches with wood blocks to help you sleep. Extra pillows will not help.  · Only take over-the-counter or prescription medicines for pain, discomfort, or fever as directed by your caregiver.  · Do not take aspirin, ibuprofen, or other nonsteroidal anti-inflammatory drugs (NSAIDs).  SEEK IMMEDIATE MEDICAL CARE IF:   · You have pain in your arms, neck, jaw, teeth, or back.  · Your pain increases or changes in intensity or duration.  · You develop nausea, vomiting, or sweating (diaphoresis).  · You develop shortness of breath, or you faint.  · Your vomit is green, yellow, black, or looks like coffee grounds or blood.  · Your stool is red, bloody, or black.  These symptoms could be signs of other problems, such as heart disease, gastric bleeding, or esophageal bleeding.  MAKE SURE YOU:   · Understand these instructions.  · Will watch your condition.  · Will get help right away if you are not doing well or get worse.  Document Released: 12/24/2004 Document Revised: 06/08/2011 Document Reviewed: 10/03/2010  Exit[employer]® Patient   Information ©2014 [employer].  Diet for Gastroesophageal Reflux Disease, Adult  Reflux (acid reflux) is when acid from your stomach flows up into the esophagus. When acid comes in contact with the esophagus, the acid causes irritation and soreness (inflammation) in the esophagus. When reflux happens often or so severely that it causes damage to the esophagus, it is called gastroesophageal reflux disease (GERD). Nutrition therapy can help ease the discomfort of GERD.  FOODS OR DRINKS TO AVOID OR  LIMIT  · Smoking or chewing tobacco. Nicotine is one of the most potent stimulants to acid production in the gastrointestinal tract.  · Caffeinated and decaffeinated coffee and black tea.  · Regular or low-calorie carbonated beverages or energy drinks (caffeine-free carbonated beverages are allowed).    · Strong spices, such as black pepper, white pepper, red pepper, cayenne, curry powder, and chili powder.  · Peppermint or spearmint.  · Chocolate.  · High-fat foods, including meats and fried foods. Extra added fats including oils, butter, salad dressings, and nuts. Limit these to less than 8 tsp per day.  · Fruits and vegetables if they are not tolerated, such as citrus fruits or tomatoes.  · Alcohol.  · Any food that seems to aggravate your condition.  If you have questions regarding your diet, call your caregiver or a registered dietitian.  OTHER THINGS THAT MAY HELP GERD INCLUDE:   · Eating your meals slowly, in a relaxed setting.  · Eating 5 to 6 small meals per day instead of 3 large meals.  · Eliminating food for a period of time if it causes distress.  · Not lying down until 3 hours after eating a meal.  · Keeping the head of your bed raised 6 to 9 inches (15 to 23 cm) by using a foam wedge or blocks under the legs of the bed. Lying flat may make symptoms worse.  · Being physically active. Weight loss may be helpful in reducing reflux in overweight or obese adults.  · Wear loose fitting clothing  EXAMPLE MEAL PLAN  This meal plan is approximately 2,000 calories based on [employer] meal planning guidelines.  Breakfast  · ½ cup cooked oatmeal.  · 1 cup strawberries.  · 1 cup low-fat milk.  · 1 oz almonds.  Snack  · 1 cup cucumber slices.  · 6 oz yogurt (made from low-fat or fat-free milk).  Lunch  · 2 slice whole-wheat bread.  · 2½ oz sliced turkey.  · 2 tsp mayonnaise.  · 1 cup blueberries.  · 1 cup snap peas.  Snack  · 6 whole-wheat crackers.  · 1 oz string cheese.  Dinner  · ½ cup brown rice.  · 1  cup mixed veggies.  · 1 tsp olive oil.  · 3 oz grilled fish.  Document Released: 03/16/2005 Document Revised: 06/08/2011 Document Reviewed: 01/30/2011  [employer]® Patient Information ©2014 [employer].

## 2012-11-15 NOTE — Progress Notes (Addendum)
Subjective:    Patient ID: Shannon Davenport, female    DOB: 19-Apr-1980, 32 y.o.   MRN: [id number]  HPI  Pt presents to the clinic today with c/o reflux. This has been a chronic problem for her but only intermittently. It has gotten worse in the last few days. She has been burping a lot. The reflux seems to be worse after she eats. She has no nausea or vomiting. She took tums which did help. She is having normal bowel movements but she did take a laxative to see if it helped. That did not help with her epigastric pain. She did go to the beach and drank some alcohol while down there. She has not taken any Advil or Aleve. She has made changes in her diet to avoid foods that cause indigestion. She has had tried to eat slower to see if that helps and it does.  Review of Systems      Past Medical History  Diagnosis Date  . 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 mometasone (NASONEX) 50 MCG/ACT nasal spray Place 2 sprays into the nose daily.  17 g  12  . mometasone-formoterol (DULERA) 200-5 MCG/ACT AERO Inhale 2 puffs into the lungs 2 (two) times daily.  13 g  11  . Multiple Vitamin (MULTIVITAMIN) tablet Take 1 tablet by mouth daily.         No current facility-administered medications for this visit.    No Known Allergies  Family History  Problem Relation Age of Onset  . Heart disease Mother   . Hyperlipidemia Mother   . Hypertension Mother   . Cancer Neg Hx     History   Social History  . Marital Status: Married    Spouse Name: N/A    Number of Children: N/A  . Years of Education: N/A   Occupational History  . Not on file.   Social History Main Topics  . Smoking status: Current Some Day Smoker -- 0.25 packs/day for 7 years    Types: Cigarettes  . Smokeless tobacco: Never Used  . Alcohol Use: 3.0 oz/week    5 Glasses of wine per week  . Drug Use: No  . Sexual Activity: Yes    Birth Control/  Protection: Condom   Other Topics Concern  . Not on file   Social History Narrative  . No narrative on file     Constitutional: Denies fever, malaise, fatigue, headache or abrupt weight changes.  Respiratory: Denies difficulty breathing, shortness of breath, cough or sputum production.   Cardiovascular: Denies chest pain, chest tightness, palpitations or swelling in the hands or feet.  Gastrointestinal: Pt reports reflux. Denies abdominal pain, bloating, constipation, diarrhea or blood in the stool.  GU: Denies urgency, frequency, pain with urination, burning sensation, blood in urine, odor or discharge.   No other specific complaints in a complete review of systems (except as listed in HPI above).  Objective:   Physical Exam  BP 122/80  Pulse 75  Temp(Src) 98.6 F (37 C) (Oral)  Resp 12  Wt 170 lb (77.111 kg)  BMI 26.62 kg/m2  SpO2 97% Wt Readings from Last 3 Encounters:  11/15/12 170 lb (77.111 kg)  05/27/12 164 lb 3.2 oz (74.481 kg)  02/02/12 162 lb 8 oz (73.71 kg)    General: Appears her stated age, well developed, well nourished in NAD.Marland Kitchen  Cardiovascular: Normal rate and rhythm. S1,S2 noted.  No murmur, rubs or  gallops noted. No JVD or BLE edema. No carotid bruits noted. Pulmonary/Chest: Normal effort and positive vesicular breath sounds. No respiratory distress. No wheezes, rales or ronchi noted.  Abdomen: Soft and teder in the epigastric area. Normal bowel sounds, no bruits noted. No distention or masses noted. Liver, spleen and kidneys non palpable.   BMET    Component Value Date/Time   NA 138 02/05/2011 1528   K 3.8 02/05/2011 1528   CL 103 02/05/2011 1528   CO2 27 02/05/2011 1528   GLUCOSE 80 02/05/2011 1528   BUN 15 02/05/2011 1528   CREATININE 0.7 02/05/2011 1528   CALCIUM 9.2 02/05/2011 1528    Lipid Panel     Component Value Date/Time   CHOL 168 02/05/2011 1528   TRIG 57.0 02/05/2011 1528   HDL 61.30 02/05/2011 1528   CHOLHDL 3 02/05/2011 1528   VLDL  11.4 02/05/2011 1528   LDLCALC 95 02/05/2011 1528    CBC    Component Value Date/Time   WBC 4.8 02/05/2011 1528   RBC 4.31 02/05/2011 1528   HGB 13.4 02/05/2011 1528   HCT 40.2 02/05/2011 1528   PLT 221.0 02/05/2011 1528   MCV 93.3 02/05/2011 1528   MCHC 33.4 02/05/2011 1528   RDW 13.6 02/05/2011 1528   LYMPHSABS 1.9 02/05/2011 1528   MONOABS 0.4 02/05/2011 1528   EOSABS 0.1 02/05/2011 1528   BASOSABS 0.1 02/05/2011 1528    Hgb A1C No results found for this basename: HGBA1C         Assessment & Plan:   GERD:  Will treat with protonix 49 mg daily Supplement with tums as needed  RTC as needed or if symptoms persist or worsen

## 2013-07-28 ENCOUNTER — Ambulatory Visit (INDEPENDENT_AMBULATORY_CARE_PROVIDER_SITE_OTHER): Payer: BC Managed Care – PPO | Admitting: Internal Medicine

## 2013-07-28 VITALS — BP 114/84 | HR 74 | Temp 98.3°F | Resp 13 | Wt 173.8 lb

## 2013-07-28 DIAGNOSIS — J209 Acute bronchitis, unspecified: Secondary | ICD-10-CM

## 2013-07-28 DIAGNOSIS — J069 Acute upper respiratory infection, unspecified: Secondary | ICD-10-CM

## 2013-07-28 DIAGNOSIS — J339 Nasal polyp, unspecified: Secondary | ICD-10-CM

## 2013-07-28 MED ORDER — AZITHROMYCIN 250 MG PO TABS: ORAL_TABLET | ORAL | Status: DC

## 2013-07-28 MED ORDER — HYDROCODONE-HOMATROPINE 5-1.5 MG/5ML PO SYRP: 5.0000 mL | ORAL_SOLUTION | Freq: Four times a day (QID) | ORAL | Status: DC | PRN

## 2013-07-28 NOTE — Patient Instructions (Signed)

## 2013-07-28 NOTE — Progress Notes (Signed)
Pre visit review using our clinic review tool, if applicable. No additional management support is needed unless otherwise documented below in the visit note. 

## 2013-07-30 ENCOUNTER — Encounter: Payer: Self-pay | Admitting: Internal Medicine

## 2013-07-30 NOTE — Progress Notes (Signed)
   Subjective:    Patient ID: Shannon Davenport, female    DOB: 11-29-1980, 33 y.o.   MRN: [id number]  HPI  Symptoms began seven - 10 days ago and have remained stable. 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 The symptoms of includes chest discomfort  with coughing spells which last three - five minutes. Cough is usually dry but she can produce scant amounts of clear - yellow sputum. She's also had some nasal secretions. She has had some reflux but this is not significant. She smokes minimally ..."three puffs a month ". She denies definite PMH of asthma but she was on Dulera in 11/13    Review of Systems She specifically denies itchy, watery eyes; fever; chills; sweats; pleuritic chest pain; frontal or facial sinus pain; wheezing; or shortness of breath.     Objective:   Physical Exam General appearance:good health ;well nourished; no acute distress or increased work of breathing is present.  No  lymphadenopathy about the head, neck, or axilla noted.   Eyes: No conjunctival inflammation or lid edema is present. Ears:  External ear exam shows no significant lesions or deformities.  Otoscopic examination reveals clear canals, tympanic membranes are intact bilaterally without bulging, retraction, inflammation or discharge.  Nose:  External nasal examination shows no deformity or inflammation. Nasal mucosa are erythematous  and moist with large polyps but without lesions or exudates. No septal dislocation or deviation.Bilateral  obstruction to airflow.   Oral exam: Dental hygiene is good; lips and gums are healthy appearing.There is no oropharyngeal erythema or exudate noted.   Neck:  No deformities, thyromegaly, masses, or tenderness noted.   Heart:  Normal rate and regular rhythm. S1 and S2 normal without gallop, murmur, click, rub or other extra sounds.   Lungs:Chest clear to auscultation; no wheezes, rhonchi,rales ,or rubs present.No increased work of breathing.      Extremities:  No cyanosis, edema, or clubbing  noted    Skin: Warm & dry          Assessment & Plan:  #1 acute bronchitis w/o bronchospasm #2 URI, acute #3 nasal polyps Plan: See orders and recommendations

## 2013-08-03 ENCOUNTER — Ambulatory Visit (INDEPENDENT_AMBULATORY_CARE_PROVIDER_SITE_OTHER): Payer: BC Managed Care – PPO | Admitting: Physician Assistant

## 2013-08-03 ENCOUNTER — Encounter: Payer: Self-pay | Admitting: Physician Assistant

## 2013-08-03 VITALS — BP 100/74 | HR 78 | Temp 98.0°F | Resp 16 | Wt 168.0 lb

## 2013-08-03 DIAGNOSIS — M542 Cervicalgia: Secondary | ICD-10-CM

## 2013-08-03 MED ORDER — MELOXICAM 7.5 MG PO TABS: 7.5000 mg | ORAL_TABLET | Freq: Every day | ORAL | Status: DC

## 2013-08-03 NOTE — Progress Notes (Signed)
Pre visit review using our clinic review tool, if applicable. No additional management support is needed unless otherwise documented below in the visit note. 

## 2013-08-03 NOTE — Progress Notes (Signed)
Subjective:    Patient ID: Shannon Davenport, female    DOB: 30-Apr-1980, 33 y.o.   MRN: [id number]  Neck Pain  This is a new problem. The current episode started in the past 7 days (Had URI last week which was treated at another clinic, states that neck pain started with URI. States she thinks it started hurting due to coughing so much.). The problem occurs constantly. The problem has been gradually worsening. Associated with: Possibly from coughing a lot with recent URI. The pain is present in the right side. The quality of the pain is described as aching (dull). The pain is at a severity of 6/10. The pain is moderate. The symptoms are aggravated by position (Quick neck movements.). The pain is worse during the day. Stiffness is present in the morning. Pertinent negatives include no chest pain, fever, headaches, leg pain, numbness, pain with swallowing, paresis, photophobia, syncope, tingling, trouble swallowing, visual change, weakness or weight loss. She has tried acetaminophen and heat for the symptoms. The treatment provided mild relief.     Review of Systems  Constitutional: Negative for fever, chills and weight loss.  HENT: Negative for trouble swallowing.   Eyes: Negative for photophobia.  Respiratory: Negative for shortness of breath.   Cardiovascular: Negative for chest pain and syncope.  Gastrointestinal: Negative for nausea, vomiting and diarrhea.  Musculoskeletal: Positive for neck pain and neck stiffness (mild stiffness in morning.). Negative for arthralgias, back pain, gait problem, joint swelling and myalgias.  Skin: Negative for color change, pallor, rash and wound.  Neurological: Negative for dizziness, tingling, weakness, light-headedness, numbness and headaches.  All other systems reviewed and are negative.  Past Medical History  Diagnosis Date  . 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 No pertinent past surgical history.  reports that she has been  smoking Cigarettes.  She has a 1.75 pack-year smoking history. She has never used smokeless tobacco. She reports that she drinks about 3 ounces of alcohol per week. She reports that she does not use illicit drugs. family history includes Heart disease in her mother; Hyperlipidemia in her mother; Hypertension in her mother. There is no history of Cancer. No Known Allergies      Objective:   Physical Exam  Nursing note and vitals reviewed. Constitutional: She is oriented to person, place, and time. She appears well-developed and well-nourished. No distress.  HENT:  Head: Normocephalic and atraumatic.  Eyes: Conjunctivae and EOM are normal. Pupils are equal, round, and reactive to light.  Neck: Normal range of motion. Neck supple. No JVD present. No tracheal deviation present.  Cardiovascular: Normal rate, regular rhythm, normal heart sounds and intact distal pulses.  Exam reveals no gallop and no friction rub.   No murmur heard. Pulmonary/Chest: Effort normal and breath sounds normal. No stridor. No respiratory distress. She has no wheezes. She has no rales. She exhibits no tenderness.  Musculoskeletal: Normal range of motion. She exhibits tenderness (There is some mild tenderness to palpation of the neck musculature on the Right, however palpation is also relieving to some degree.). She exhibits no edema.  There is no visible distortion to the neck musculature.  There is no bony tenderness.  Normal active and passive range of motion in bilateral arms.  Lymphadenopathy:    She has no cervical adenopathy.  Neurological: She is alert and oriented to person, place, and time. She has normal reflexes. She exhibits normal muscle tone. Coordination normal.  Sensation intact in bilateral arms.  Skin: Skin  is warm and dry. No rash noted. She is not diaphoretic. No erythema. No pallor.  Psychiatric: She has a normal mood and affect. Her behavior is normal. Judgment and thought content normal.    Filed Vitals:   08/03/13 1602  BP: 100/74  Pulse: 78  Temp: 98 F (36.7 C)  Resp: 16    Lab Results  Component Value Date   WBC 4.8 02/05/2011   HGB 13.4 02/05/2011   HCT 40.2 02/05/2011   PLT 221.0 02/05/2011   GLUCOSE 80 02/05/2011   CHOL 168 02/05/2011   TRIG 57.0 02/05/2011   HDL 61.30 02/05/2011   LDLCALC 95 02/05/2011   ALT 18 02/05/2011   AST 26 02/05/2011   NA 138 02/05/2011   K 3.8 02/05/2011   CL 103 02/05/2011   CREATININE 0.7 02/05/2011   BUN 15 02/05/2011   CO2 27 02/05/2011   TSH 0.60 02/05/2011      Assessment & Plan:  Shannon Davenport was seen today for neck pain.  Diagnoses and associated orders for this visit:  Neck pain - meloxicam (MOBIC) 7.5 MG tablet; Take 1 tablet (7.5 mg total) by mouth daily.   Plain to try symptomatic treatment with prescription strength NSAID and possibly professional neck massage. Plantar followup if symptoms do not improve despite conservative treatment.  Patient Instructions  Meloxicam one pill daily to control inflammation and pain.  Try to rest the neck is much as possible. You can use a heating pad and apply firm pressure on the area to try to help pain.  You can also try to use ice to help decrease the inflammation.  A sports massage might also be helpful to relieve some of the pain.  Followup to clinic if symptoms worsen or fail to improve despite treatment.

## 2013-08-03 NOTE — Patient Instructions (Signed)
Meloxicam one pill daily to control inflammation and pain.  Try to rest the neck is much as possible. You can use a heating pad and apply firm pressure on the area to try to help pain.  You can also try to use ice to help decrease the inflammation.  A sports massage might also be helpful to relieve some of the pain.  Followup to clinic if symptoms worsen or fail to improve despite treatment.  RICE: Routine Care for Injuries Rest, Ice, Compression, and Elevation (RICE) are often used to care for injuries. HOME CARE  Rest your injury.  Put ice on the injury.  Put ice in a plastic bag.  Place a towel between your skin and the bag.  Leave the ice on for 15-20 minutes, 03-04 times a day. Do this for as long as told by your doctor.  Apply pressure (compression) with an elastic bandage. Remove and reapply the bandage every 3 to 4 hours. Do not wrap the bandage too tight. Wrap the bandage looser if the fingers or toes are puffy (swollen), blue, cold, painful, or lose feeling (numb).  Raise (elevate) your injury. Raise your injury above the heart if you can. GET HELP RIGHT AWAY IF:  You have lasting pain or puffiness.  Your injury is red, weak, or loses feeling.  Your problems get worse, not better, after several days. MAKE SURE YOU:  Understand these instructions.  Will watch your condition.  Will get help right away if you are not doing well or get worse. Document Released: 09/02/2007 Document Revised: 06/08/2011 Document Reviewed: 08/15/2010 [facility] Patient Information 2014 [place], [place].

## 2014-02-15 ENCOUNTER — Telehealth: Payer: Self-pay | Admitting: Internal Medicine

## 2014-02-15 MED ORDER — VALACYCLOVIR HCL 1 G PO TABS
1000.0000 mg | ORAL_TABLET | Freq: Two times a day (BID) | ORAL | Status: AC
Start: 2014-02-15 — End: 2014-02-22

## 2014-02-15 NOTE — Telephone Encounter (Signed)
Pt request refill for Valtrex due to fever blister, pt saw Dr. Linna Darner 07/2013. Pt used this as needed that's why she hasnt request this med. Please advise.

## 2014-02-15 NOTE — Telephone Encounter (Signed)
done

## 2014-02-15 NOTE — Telephone Encounter (Signed)
Left detail massage for pt on cell phone.

## 2014-03-28 ENCOUNTER — Ambulatory Visit (INDEPENDENT_AMBULATORY_CARE_PROVIDER_SITE_OTHER): Payer: BC Managed Care – PPO | Admitting: Internal Medicine

## 2014-03-28 ENCOUNTER — Encounter: Payer: Self-pay | Admitting: Internal Medicine

## 2014-03-28 VITALS — BP 122/82 | HR 74 | Temp 98.7°F | Ht 67.0 in | Wt 163.5 lb

## 2014-03-28 DIAGNOSIS — K219 Gastro-esophageal reflux disease without esophagitis: Secondary | ICD-10-CM

## 2014-03-28 MED ORDER — RANITIDINE HCL 150 MG PO TABS: 150.0000 mg | ORAL_TABLET | Freq: Two times a day (BID) | ORAL | Status: DC

## 2014-03-28 NOTE — Progress Notes (Signed)
   Subjective:    Patient ID: Shannon Davenport, female    DOB: [birth year], 33 y.o.   MRN: [id number]  HPI  Symptoms began 12/23 or 12/24 as some epigastric discomfort. There was radiation superiorly of the discomfort. Episodes are intermittent but last 20-30 minutes.  She had used apple cider vinegar , lemon water , Tums , licorice root, & ginger tea with some benefit  Burping does help the symptoms  Sometimes is worse with food. It can also be worse in the evening. It is nonexertional.  She is not pregnant; last menses was 1217-12/21.  Review of Systems Unexplained weight loss, abdominal pain, significant dyspepsia, dysphagia, melena, rectal bleeding, or persistently small caliber stools are denied.  She denies dysuria, pyuria, or hematuria    Objective:   Physical Exam Exam is unremarkable except for a nasal polyp on the left.  General appearance :adequately nourished; in no distress. Eyes: No conjunctival inflammation or scleral icterus is present. Oral exam: Dental hygiene is good. Lips and gums are healthy appearing.There is no oropharyngeal erythema or exudate noted.  Heart:  Normal rate and regular rhythm. S1 and S2 normal without gallop, murmur, click, rub or other extra sounds   Lungs:Chest clear to auscultation; no wheezes, rhonchi,rales ,or rubs present.No increased work of breathing.  Abdomen: bowel sounds normal, soft and non-tender without masses, organomegaly or hernias noted.  No guarding or rebound. Vascular : all pulses equal ; no bruits present. Skin:Warm & dry.  Intact without suspicious lesions or rashes ; no tenting Lymphatic: No lymphadenopathy is noted about the head, neck, axilla           Assessment & Plan:  #1 GERD  See orders & AVs

## 2014-03-28 NOTE — Patient Instructions (Signed)
Reflux of gastric acid may be asymptomatic as this may occur mainly during sleep.The triggers for reflux  include stress; the "aspirin family" ; alcohol; peppermint; and caffeine (coffee, tea, cola, and chocolate). The aspirin family would include aspirin and the nonsteroidal agents such as ibuprofen &  Naproxen. Tylenol would not cause reflux. If having symptoms ; food & drink should be avoided for @ least 2 hours before going to bed. Take the stomach acid inhibitor 30 minutes before breakfast and 30 minutes before the evening meal for up to 8 weeks then as needed only

## 2014-03-28 NOTE — Progress Notes (Signed)
Pre visit review using our clinic review tool, if applicable. No additional management support is needed unless otherwise documented below in the visit note. 

## 2014-04-04 ENCOUNTER — Telehealth: Payer: Self-pay | Admitting: Internal Medicine

## 2014-04-04 NOTE — Telephone Encounter (Signed)
emmi emailed °

## 2014-05-29 ENCOUNTER — Ambulatory Visit (INDEPENDENT_AMBULATORY_CARE_PROVIDER_SITE_OTHER): Payer: BC Managed Care – PPO | Admitting: Internal Medicine

## 2014-05-29 ENCOUNTER — Encounter: Payer: Self-pay | Admitting: Internal Medicine

## 2014-05-29 VITALS — BP 130/96 | HR 92 | Temp 98.7°F | Resp 17 | Ht 67.0 in | Wt 167.0 lb

## 2014-05-29 DIAGNOSIS — J209 Acute bronchitis, unspecified: Secondary | ICD-10-CM

## 2014-05-29 DIAGNOSIS — J310 Chronic rhinitis: Secondary | ICD-10-CM

## 2014-05-29 MED ORDER — AMOXICILLIN 500 MG PO CAPS: 500.0000 mg | ORAL_CAPSULE | Freq: Three times a day (TID) | ORAL | Status: DC

## 2014-05-29 MED ORDER — HYDROCODONE-HOMATROPINE 5-1.5 MG/5ML PO SYRP: 5.0000 mL | ORAL_SOLUTION | Freq: Four times a day (QID) | ORAL | Status: DC | PRN

## 2014-05-29 NOTE — Patient Instructions (Signed)

## 2014-05-29 NOTE — Progress Notes (Signed)
   Subjective:    Patient ID: Shannon Davenport, female    DOB: December 29, 1980, 34 y.o.   MRN: [id number]  HPI  Her symptoms began 2/26 as scratchy throat followed by cough 2/28 with yellow sputum. She also had myalgias/arthralgias of the upper body. Cough does result in chest discomfort. The cough is worse at night affecting sleep.  She has scant nasal discharge. She describes itchy, scratchy throat.  The cough has been associated with some shortness of breath and wheezing. She has no other upper respiratory tract infection symptoms.  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  Review of Systems Frontal headache, facial pain , significant nasal purulence, dental pain, sore throat , otic pain or otic discharge denied. No fever , chills or sweats.    Objective:   Physical Exam  Positive or pertinent findings include:  There is significant erythema of the nares with bilateral boggy edema and polyp formation.  General appearance:Adequately nourished; no acute distress or increased work of breathing is present.  No  lymphadenopathy about the head, neck, or axilla noted.   Eyes: No conjunctival inflammation or lid edema is present. There is no scleral icterus.  Ears:  External ear exam shows no significant lesions or deformities.  Otoscopic examination reveals clear canals, tympanic membranes are intact bilaterally without bulging, retraction, inflammation or discharge.  Nose:  External nasal examination shows no deformity or inflammation.    Oral exam: Dental hygiene is good; lips and gums are healthy appearing.There is no oropharyngeal erythema or exudate noted.   Neck:  No deformities, thyromegaly, masses, or tenderness noted.   Supple with full range of motion without pain.   Heart:  Normal rate and regular rhythm. S1 and S2 normal without gallop, murmur, click, rub or other extra sounds.   Lungs:Chest clear to  auscultation; no wheezes, rhonchi,rales ,or rubs present.  Extremities:  No cyanosis, edema, or clubbing  noted    Skin: Warm & dry w/o tenting.       Assessment & Plan:  #1 acute bronchitis w/o bronchospasm #2 rhinitis Plan: See orders and recommendations

## 2014-05-29 NOTE — Progress Notes (Signed)
Pre visit review using our clinic review tool, if applicable. No additional management support is needed unless otherwise documented below in the visit note. 

## 2014-06-12 ENCOUNTER — Telehealth: Payer: Self-pay | Admitting: Internal Medicine

## 2014-06-12 DIAGNOSIS — J209 Acute bronchitis, unspecified: Secondary | ICD-10-CM

## 2014-06-12 NOTE — Telephone Encounter (Signed)
Pt stated she saw Dr. Linna Darner last 2 time and she was wondering if Dr.Hopper can help her while Dr. Ronnald Ramp is out.

## 2014-06-12 NOTE — Telephone Encounter (Signed)
Pt needs a refill on hydromet syrup. Please advise

## 2014-06-12 NOTE — Telephone Encounter (Signed)
OK X1 

## 2014-06-13 MED ORDER — HYDROCODONE-HOMATROPINE 5-1.5 MG/5ML PO SYRP: 5.0000 mL | ORAL_SOLUTION | Freq: Four times a day (QID) | ORAL | Status: DC | PRN

## 2014-06-13 NOTE — Telephone Encounter (Signed)
Left message for patient stating script is at the front desk for her to pick up

## 2014-12-13 ENCOUNTER — Encounter (HOSPITAL_COMMUNITY)
Admission: AD | Disposition: A | Discharge status: Home / Self Care | Payer: Self-pay | Source: Ambulatory Visit | Attending: Obstetrics and Gynecology

## 2014-12-13 ENCOUNTER — Encounter (HOSPITAL_COMMUNITY): Payer: Self-pay | Admitting: Anesthesiology

## 2014-12-13 ENCOUNTER — Ambulatory Visit (HOSPITAL_COMMUNITY): Payer: BC Managed Care – PPO | Admitting: Anesthesiology

## 2014-12-13 ENCOUNTER — Ambulatory Visit (HOSPITAL_COMMUNITY)
Admission: AD | Admit: 2014-12-13 | Discharge: 2014-12-13 | Disposition: A | Discharge status: Home / Self Care | Payer: BC Managed Care – PPO | Source: Ambulatory Visit | Attending: Obstetrics and Gynecology | Admitting: Obstetrics and Gynecology

## 2014-12-13 DIAGNOSIS — O009 Unspecified ectopic pregnancy without intrauterine pregnancy: Secondary | ICD-10-CM

## 2014-12-13 DIAGNOSIS — O001 Tubal pregnancy: Secondary | ICD-10-CM | POA: Diagnosis present

## 2014-12-13 HISTORY — PX: LAPAROSCOPY: SHX197

## 2014-12-13 HISTORY — DX: Unspecified ectopic pregnancy without intrauterine pregnancy: O00.90

## 2014-12-13 HISTORY — PX: UNILATERAL SALPINGECTOMY: SHX6160

## 2014-12-13 LAB — TYPE AND SCREEN
ABO/RH(D): O POS
Antibody Screen: NEGATIVE

## 2014-12-13 LAB — CBC
HCT: 37.5 % (ref 36.0–46.0)
Hemoglobin: 12.5 g/dL (ref 12.0–15.0)
MCH: 29.8 pg (ref 26.0–34.0)
MCHC: 33.3 g/dL (ref 30.0–36.0)
MCV: 89.5 fL (ref 78.0–100.0)
PLATELETS: 217 10*3/uL (ref 150–400)
RBC: 4.19 MIL/uL (ref 3.87–5.11)
RDW: 13.6 % (ref 11.5–15.5)
WBC: 7 10*3/uL (ref 4.0–10.5)

## 2014-12-13 LAB — ABO/RH: ABO/RH(D): O POS

## 2014-12-13 SURGERY — LAPAROSCOPY OPERATIVE
Anesthesia: General | Laterality: Right

## 2014-12-13 MED ORDER — HEPARIN SODIUM (PORCINE) 5000 UNIT/ML IJ SOLN
INTRAMUSCULAR | Status: AC
  Filled 2014-12-13: qty 1

## 2014-12-13 MED ORDER — LIDOCAINE HCL (CARDIAC) 20 MG/ML IV SOLN
INTRAVENOUS | Status: DC | PRN
  Administered 2014-12-13: 100 mg via INTRAVENOUS

## 2014-12-13 MED ORDER — FENTANYL CITRATE (PF) 100 MCG/2ML IJ SOLN
INTRAMUSCULAR | Status: DC | PRN
Start: 2014-12-13 — End: 2014-12-13
  Administered 2014-12-13: 100 ug via INTRAVENOUS
  Administered 2014-12-13: 50 ug via INTRAVENOUS
  Administered 2014-12-13: 100 ug via INTRAVENOUS

## 2014-12-13 MED ORDER — CEFAZOLIN SODIUM-DEXTROSE 2-3 GM-% IV SOLR
INTRAVENOUS | Status: DC | PRN
  Administered 2014-12-13: 2 g via INTRAVENOUS

## 2014-12-13 MED ORDER — ONDANSETRON HCL 4 MG/2ML IJ SOLN
INTRAMUSCULAR | Status: AC
  Filled 2014-12-13: qty 2

## 2014-12-13 MED ORDER — SCOPOLAMINE 1 MG/3DAYS TD PT72
MEDICATED_PATCH | TRANSDERMAL | Status: AC
  Administered 2014-12-13: 1.5 mg via TRANSDERMAL
  Filled 2014-12-13: qty 1

## 2014-12-13 MED ORDER — DEXAMETHASONE SODIUM PHOSPHATE 10 MG/ML IJ SOLN
INTRAMUSCULAR | Status: DC | PRN
  Administered 2014-12-13: 4 mg via INTRAVENOUS

## 2014-12-13 MED ORDER — PROMETHAZINE HCL 25 MG/ML IJ SOLN
6.2500 mg | INTRAMUSCULAR | Status: DC | PRN
Start: 2014-12-13 — End: 2014-12-14

## 2014-12-13 MED ORDER — ROCURONIUM BROMIDE 100 MG/10ML IV SOLN
INTRAVENOUS | Status: AC
  Filled 2014-12-13: qty 1

## 2014-12-13 MED ORDER — MIDAZOLAM HCL 2 MG/2ML IJ SOLN
INTRAMUSCULAR | Status: AC
  Filled 2014-12-13: qty 4

## 2014-12-13 MED ORDER — PROPOFOL 10 MG/ML IV BOLUS
INTRAVENOUS | Status: DC | PRN
  Administered 2014-12-13: 180 mg via INTRAVENOUS

## 2014-12-13 MED ORDER — GLYCOPYRROLATE 0.2 MG/ML IJ SOLN
INTRAMUSCULAR | Status: DC | PRN
  Administered 2014-12-13: 0.6 mg via INTRAVENOUS

## 2014-12-13 MED ORDER — KETOROLAC TROMETHAMINE 30 MG/ML IJ SOLN
INTRAMUSCULAR | Status: AC
  Filled 2014-12-13: qty 1

## 2014-12-13 MED ORDER — FENTANYL CITRATE (PF) 100 MCG/2ML IJ SOLN
INTRAMUSCULAR | Status: AC
  Filled 2014-12-13: qty 2

## 2014-12-13 MED ORDER — DEXAMETHASONE SODIUM PHOSPHATE 4 MG/ML IJ SOLN
INTRAMUSCULAR | Status: AC
  Filled 2014-12-13: qty 1

## 2014-12-13 MED ORDER — ACETAMINOPHEN 10 MG/ML IV SOLN
1000.0000 mg | Freq: Once | INTRAVENOUS | Status: AC
  Administered 2014-12-13: 1000 mg via INTRAVENOUS
  Filled 2014-12-13: qty 100

## 2014-12-13 MED ORDER — FENTANYL CITRATE (PF) 100 MCG/2ML IJ SOLN
25.0000 ug | INTRAMUSCULAR | Status: DC | PRN
  Administered 2014-12-13 (×2): 50 ug via INTRAVENOUS

## 2014-12-13 MED ORDER — LIDOCAINE HCL (CARDIAC) 20 MG/ML IV SOLN
INTRAVENOUS | Status: AC
  Filled 2014-12-13: qty 5

## 2014-12-13 MED ORDER — SCOPOLAMINE 1 MG/3DAYS TD PT72
1.0000 | MEDICATED_PATCH | Freq: Once | TRANSDERMAL | Status: DC
  Administered 2014-12-13: 1.5 mg via TRANSDERMAL

## 2014-12-13 MED ORDER — NEOSTIGMINE METHYLSULFATE 10 MG/10ML IV SOLN
INTRAVENOUS | Status: AC
  Filled 2014-12-13: qty 1

## 2014-12-13 MED ORDER — KETOROLAC TROMETHAMINE 30 MG/ML IJ SOLN
INTRAMUSCULAR | Status: DC | PRN
  Administered 2014-12-13: 30 mg via INTRAVENOUS

## 2014-12-13 MED ORDER — NEOSTIGMINE METHYLSULFATE 10 MG/10ML IV SOLN
INTRAVENOUS | Status: DC | PRN
  Administered 2014-12-13: 3 mg via INTRAVENOUS

## 2014-12-13 MED ORDER — MIDAZOLAM HCL 2 MG/2ML IJ SOLN
INTRAMUSCULAR | Status: DC | PRN
  Administered 2014-12-13: 2 mg via INTRAVENOUS

## 2014-12-13 MED ORDER — CEFAZOLIN SODIUM-DEXTROSE 2-3 GM-% IV SOLR
INTRAVENOUS | Status: AC
  Filled 2014-12-13: qty 50

## 2014-12-13 MED ORDER — LACTATED RINGERS IV SOLN
INTRAVENOUS | Status: DC
  Administered 2014-12-13 (×2): via INTRAVENOUS

## 2014-12-13 MED ORDER — BUPIVACAINE HCL (PF) 0.25 % IJ SOLN
INTRAMUSCULAR | Status: AC
  Filled 2014-12-13: qty 30

## 2014-12-13 MED ORDER — OXYCODONE HCL 5 MG PO TABS: 5.0000 mg | ORAL_TABLET | Freq: Once | ORAL | Status: DC | PRN

## 2014-12-13 MED ORDER — ONDANSETRON HCL 4 MG/2ML IJ SOLN
INTRAMUSCULAR | Status: DC | PRN
  Administered 2014-12-13: 4 mg via INTRAVENOUS

## 2014-12-13 MED ORDER — OXYCODONE HCL 5 MG/5ML PO SOLN: 5.0000 mg | Freq: Once | ORAL | Status: DC | PRN

## 2014-12-13 MED ORDER — FENTANYL CITRATE (PF) 250 MCG/5ML IJ SOLN
INTRAMUSCULAR | Status: AC
  Filled 2014-12-13: qty 25

## 2014-12-13 MED ORDER — SUCCINYLCHOLINE CHLORIDE 20 MG/ML IJ SOLN
INTRAMUSCULAR | Status: AC
  Filled 2014-12-13: qty 1

## 2014-12-13 MED ORDER — GLYCOPYRROLATE 0.2 MG/ML IJ SOLN
INTRAMUSCULAR | Status: AC
  Filled 2014-12-13: qty 3

## 2014-12-13 MED ORDER — HYDROCODONE-IBUPROFEN 7.5-200 MG PO TABS: 1.0000 | ORAL_TABLET | Freq: Three times a day (TID) | ORAL | Status: DC | PRN

## 2014-12-13 MED ORDER — ROCURONIUM BROMIDE 100 MG/10ML IV SOLN
INTRAVENOUS | Status: DC | PRN
  Administered 2014-12-13: 40 mg via INTRAVENOUS

## 2014-12-13 MED ORDER — PROPOFOL 10 MG/ML IV BOLUS
INTRAVENOUS | Status: AC
  Filled 2014-12-13: qty 20

## 2014-12-13 SURGICAL SUPPLY — 30 items
CABLE HIGH FREQUENCY MONO STRZ (ELECTRODE) IMPLANT
CATH ROBINSON RED A/P 16FR (CATHETERS) IMPLANT
CLOTH BEACON ORANGE TIMEOUT ST (SAFETY) ×4 IMPLANT
DRSG COVADERM PLUS 2X2 (GAUZE/BANDAGES/DRESSINGS) ×8 IMPLANT
DRSG OPSITE POSTOP 3X4 (GAUZE/BANDAGES/DRESSINGS) ×4 IMPLANT
FORCEPS CUTTING 33CM 5MM (CUTTING FORCEPS) ×4 IMPLANT
FORCEPS CUTTING 45CM 5MM (CUTTING FORCEPS) IMPLANT
GLOVE BIO SURGEON STRL SZ7.5 (GLOVE) ×8 IMPLANT
GLOVE BIOGEL PI IND STRL 7.0 (GLOVE) ×6 IMPLANT
GLOVE BIOGEL PI INDICATOR 7.0 (GLOVE) ×6
GOWN STRL REUS W/TWL LRG LVL3 (GOWN DISPOSABLE) ×16 IMPLANT
LIQUID BAND (GAUZE/BANDAGES/DRESSINGS) ×4 IMPLANT
NEEDLE INSUFFLATION 120MM (ENDOMECHANICALS) ×4 IMPLANT
PACK LAPAROSCOPY BASIN (CUSTOM PROCEDURE TRAY) ×4 IMPLANT
PAD POSITIONING PINK XL (MISCELLANEOUS) ×4 IMPLANT
POUCH SPECIMEN RETRIEVAL 10MM (ENDOMECHANICALS) ×4 IMPLANT
RETRACT II ENDO 10MM 32CML (ENDOMECHANICALS) ×4
RETRACTOR II ENDO 10MM 32CML (ENDOMECHANICALS) ×2 IMPLANT
SET IRRIG TUBING LAPAROSCOPIC (IRRIGATION / IRRIGATOR) ×4 IMPLANT
SLEEVE XCEL OPT CAN 5 100 (ENDOMECHANICALS) IMPLANT
SOLUTION ELECTROLUBE (MISCELLANEOUS) ×4 IMPLANT
SUT VICRYL 0 UR6 27IN ABS (SUTURE) ×4 IMPLANT
SUT VICRYL 4-0 PS2 18IN ABS (SUTURE) ×8 IMPLANT
TOWEL OR 17X24 6PK STRL BLUE (TOWEL DISPOSABLE) ×8 IMPLANT
TRAY FOLEY CATH SILVER 14FR (SET/KITS/TRAYS/PACK) ×4 IMPLANT
TROCAR OPTI TIP 5M 100M (ENDOMECHANICALS) IMPLANT
TROCAR XCEL DIL TIP R 11M (ENDOMECHANICALS) ×4 IMPLANT
TROCAR XCEL NON-BLD 11X100MML (ENDOMECHANICALS) ×4 IMPLANT
WARMER LAPAROSCOPE (MISCELLANEOUS) ×4 IMPLANT
WATER STERILE IRR 1000ML POUR (IV SOLUTION) ×4 IMPLANT

## 2014-12-13 NOTE — Transfer of Care (Signed)
Immediate Anesthesia Transfer of Care Note  Patient: Shannon Davenport  Procedure(s) Performed: Procedure(s): LAPAROSCOPY OPERATIVE (N/A) UNILATERAL SALPINGECTOMY (Right)  Patient Location: PACU  Anesthesia Type:General  Level of Consciousness: awake, alert  and oriented  Airway & Oxygen Therapy: Patient Spontanous Breathing and Patient connected to nasal cannula oxygen  Post-op Assessment: Report given to RN and Post -op Vital signs reviewed and stable  Post vital signs: Reviewed and stable  Last Vitals:  Filed Vitals:   12/13/14 1313  BP: 126/67  Pulse: 89  Temp: 36.9 C  Resp: 18    Complications: No apparent anesthesia complications

## 2014-12-13 NOTE — Progress Notes (Signed)
Patient ID: Shannon Davenport, female   DOB: Feb 28, 1981, 34 y.o.   MRN: [id number] Patient seen and examined. Consent witnessed and signed. No changes noted. Update completed.

## 2014-12-13 NOTE — H&P (Signed)
CC: L ectopic preg  HPI: 34 yo [id number] with h/o infertility and current Clomid conception presents for confirmation of pregnancy after elevated b-hcg. Pt denies abdominal pain or vaginal bleeding.  On evaluation. 2.9 x 2.2 x 2.7 L adnexal mass with YS and FH c/w ectopic pregnancy seen. 10 cm anterior fibroid without IUP seen.  Past Medical History  Diagnosis Date  . 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   All: none, no abx, no latex, no betadine/ shellfish  Meds: PNV, Valtrex  PE: In ofice: well appearing, no distress, normal gait  A/P: L ectopic pregnancy/ R/B of surgery d/w pt, Plan for salpingectomy. D/w pt she is not a candidate for medical management due to high failure rate given ectopic with heartbeat.  Transfer to Dr. Ronita Hipps for surgical care.   Ashtan Girtman A. 12/13/2014 12:58 PM

## 2014-12-13 NOTE — Discharge Instructions (Signed)
°Ectopic Pregnancy °An ectopic pregnancy is when the fertilized egg attaches (implants) outside the uterus. Most ectopic pregnancies occur in the fallopian tube. Rarely do ectopic pregnancies occur on the ovary, intestine, pelvis, or cervix. In an ectopic pregnancy, the fertilized egg does not have the ability to develop into a normal, healthy baby.  °A ruptured ectopic pregnancy is one in which the fallopian tube gets torn or bursts and results in internal bleeding. Often there is intense abdominal pain, and sometimes, vaginal bleeding. Having an ectopic pregnancy can be life threatening. If left untreated, this dangerous condition can lead to a blood transfusion, abdominal surgery, or even death. °CAUSES  °Damage to the fallopian tubes is the suspected cause in most ectopic pregnancies.  °RISK FACTORS °Depending on your circumstances, the risk of having an ectopic pregnancy will vary. The level of risk can be divided into three categories. °High Risk °· You have gone through infertility treatment. °· You have had a previous ectopic pregnancy. °· You have had previous tubal surgery. °· You have had previous surgery to have the fallopian tubes tied (tubal ligation). °· You have tubal problems or diseases. °· You have been exposed to DES. DES is a medicine that was used until 1971 and had effects on babies whose mothers took the medicine. °· You become pregnant while using an intrauterine device (IUD) for birth control.  °Moderate Risk °· You have a history of infertility. °· You have a history of a sexually transmitted infection (STI). °· You have a history of pelvic inflammatory disease (PID). °· You have scarring from endometriosis. °· You have multiple sexual partners. °· You smoke.  °Low Risk °· You have had previous pelvic surgery. °· You use vaginal douching. °· You became sexually active before 34 years of age. °SIGNS AND SYMPTOMS  °An ectopic pregnancy should be suspected in anyone who has missed a period  and has abdominal pain or bleeding. °· You may experience normal pregnancy symptoms, such as: °¨ Nausea. °¨ Tiredness. °¨ Breast tenderness. °· Other symptoms may include: °¨ Pain with intercourse. °¨ Irregular vaginal bleeding or spotting. °¨ Cramping or pain on one side or in the lower abdomen. °¨ Fast heartbeat. °¨ Passing out while having a bowel movement. °· Symptoms of a ruptured ectopic pregnancy and internal bleeding may include: °¨ Sudden, severe pain in the abdomen and pelvis. °¨ Dizziness or fainting. °¨ Pain in the shoulder area. °DIAGNOSIS  °Tests that may be performed include: °· A pregnancy test. °· An ultrasound test. °· Testing the specific level of pregnancy hormone in the bloodstream. °· Taking a sample of uterus tissue (dilation and curettage, D&C). °· Surgery to perform a visual exam of the inside of the abdomen using a thin, lighted tube with a tiny camera on the end (laparoscope). °TREATMENT  °An injection of a medicine called methotrexate may be given. This medicine causes the pregnancy tissue to be absorbed. It is given if: °· The diagnosis is made early. °· The fallopian tube has not ruptured. °· You are considered to be a good candidate for the medicine. °Usually, pregnancy hormone blood levels are checked after methotrexate treatment. This is to be sure the medicine is effective. It may take 4-6 weeks for the pregnancy to be absorbed (though most pregnancies will be absorbed by 3 weeks). °Surgical treatment may be needed. A laparoscope may be used to remove the pregnancy tissue. If severe internal bleeding occurs, a cut (incision) may be made in the lower abdomen (laparotomy), and the ectopic   pregnancy is removed. This stops the bleeding. Part of the fallopian tube, or the whole tube, may be removed as well (salpingectomy). After surgery, pregnancy hormone tests may be done to be sure there is no pregnancy tissue left. You may receive a Rho (D) immune globulin shot if you are Rh negative  and the father is Rh positive, or if you do not know the Rh type of the father. This is to prevent problems with any future pregnancy. °SEEK IMMEDIATE MEDICAL CARE IF:  °You have any symptoms of an ectopic pregnancy. This is a medical emergency. °MAKE SURE YOU: °· Understand these instructions. °· Will watch your condition. °· Will get help right away if you are not doing well or get worse. °Document Released: 04/23/2004 Document Revised: 07/31/2013 Document Reviewed: 10/13/2012 °ExitCare® Patient Information ©2015 [employer]. This information is not intended to replace advice given to you by your health care provider. Make sure you discuss any questions you have with your health care provider. ° ° °

## 2014-12-13 NOTE — Op Note (Signed)
12/13/2014  6:20 PM  PATIENT:  Shannon Davenport  34 y.o. female  PRE-OPERATIVE DIAGNOSIS:  Ectopic Pregnancy  POST-OPERATIVE DIAGNOSIS:  Ectopic Pregnancy  PROCEDURE:  Procedure(s): LAPAROSCOPY OPERATIVE UNILATERAL SALPINGECTOMY LYSIS OF ADHESIONS   SURGEON:  Surgeon(s): Brien Few, MD  ASSISTANTS: DAWSON, CNM   ANESTHESIA:   local and general  ESTIMATED BLOOD LOSS: minimal  DRAINS: none   LOCAL MEDICATIONS USED:  MARCAINE    and Amount: 20 ml  SPECIMEN:  Source of Specimen:  right tube with ectopic  DISPOSITION OF SPECIMEN:  PATHOLOGY  COUNTS:  YES  DICTATION #: dictated  PLAN OF CARE: dc home  PATIENT DISPOSITION:  PACU - hemodynamically stable.

## 2014-12-13 NOTE — Anesthesia Postprocedure Evaluation (Signed)
  Anesthesia Post-op Note  Patient: Shannon Davenport  Procedure(s) Performed: Procedure(s) (LRB): LAPAROSCOPY OPERATIVE (N/A) UNILATERAL SALPINGECTOMY (Right)  Patient Location: PACU  Anesthesia Type: General  Level of Consciousness: awake and alert   Airway and Oxygen Therapy: Patient Spontanous Breathing  Post-op Pain: mild  Post-op Assessment: Post-op Vital signs reviewed, Patient's Cardiovascular Status Stable, Respiratory Function Stable, Patent Airway and No signs of Nausea or vomiting  Last Vitals:  Filed Vitals:   12/13/14 1915  BP:   Pulse: 72  Temp:   Resp: 14    Post-op Vital Signs: stable   Complications: No apparent anesthesia complications

## 2014-12-13 NOTE — Anesthesia Preprocedure Evaluation (Addendum)
Anesthesia Evaluation  Patient identified by MRN, date of birth, ID band Patient awake    Reviewed: Allergy & Precautions, H&P , NPO status , Patient's Chart, lab work & pertinent test results  History of Anesthesia Complications Negative for: history of anesthetic complications  Airway Mallampati: II  TM Distance: >3 FB Neck ROM: full    Dental no notable dental hx.    Pulmonary asthma , former smoker,  Takes no meds for asthma, denies having it   Pulmonary exam normal breath sounds clear to auscultation       Cardiovascular negative cardio ROS Normal cardiovascular exam Rhythm:regular Rate:Normal     Neuro/Psych negative neurological ROS     GI/Hepatic negative GI ROS, Neg liver ROS,   Endo/Other  negative endocrine ROS  Renal/GU negative Renal ROS     Musculoskeletal   Abdominal   Peds  Hematology negative hematology ROS (+)   Anesthesia Other Findings NPO appropriate, allergies reviewed Denies active cardiac or pulmonary symptoms, METS > 4 No recent congestive cough or symptoms of upper respiratory infection    Reproductive/Obstetrics negative OB ROS                           Anesthesia Physical Anesthesia Plan  ASA: II  Anesthesia Plan: General   Post-op Pain Management:    Induction: Intravenous  Airway Management Planned: Oral ETT  Additional Equipment:   Intra-op Plan:   Post-operative Plan: Extubation in OR  Informed Consent: I have reviewed the patients History and Physical, chart, labs and discussed the procedure including the risks, benefits and alternatives for the proposed anesthesia with the patient or authorized representative who has indicated his/her understanding and acceptance.     Plan Discussed with: Anesthesiologist, CRNA and Surgeon  Anesthesia Plan Comments: (Urgent presentation for ectopic pregnancy, labs pending, GA with Oral ETT)        Anesthesia Quick Evaluation

## 2014-12-13 NOTE — OR Nursing (Signed)
Patient was consented for left salpingectomy, however it was her right fallopian tube that had the ectopic. Therefore the right tube was removed. Surgeon is aware

## 2014-12-13 NOTE — Anesthesia Procedure Notes (Signed)
Procedure Name: Intubation Date/Time: 12/13/2014 5:14 PM Performed by: Hewitt Blade Pre-anesthesia Checklist: Patient identified, Emergency Drugs available, Suction available and Patient being monitored Patient Re-evaluated:Patient Re-evaluated prior to inductionOxygen Delivery Method: Circle system utilized Preoxygenation: Pre-oxygenation with 100% oxygen Intubation Type: IV induction Ventilation: Mask ventilation without difficulty Laryngoscope Size: Mac and 3 Grade View: Grade I Tube type: Oral Tube size: 7.0 mm Number of attempts: 1 Airway Equipment and Method: Stylet Placement Confirmation: ETT inserted through vocal cords under direct vision,  positive ETCO2 and breath sounds checked- equal and bilateral Secured at: 21 cm Tube secured with: Tape Dental Injury: Teeth and Oropharynx as per pre-operative assessment

## 2014-12-14 ENCOUNTER — Encounter (HOSPITAL_COMMUNITY): Payer: Self-pay | Admitting: Obstetrics and Gynecology

## 2014-12-14 NOTE — Op Note (Signed)
NAME:  Shannon Davenport, Shannon Davenport NO.:  [id number]  MEDICAL RECORD NO.:  [id number]  LOCATION:  [facility]                          FACILITY:  [facility]  PHYSICIAN:  Lovenia Kim, M.D.DATE OF BIRTH:  1980-05-14  DATE OF PROCEDURE:  12/13/2014 DATE OF DISCHARGE:  12/13/2014                              OPERATIVE REPORT   PREOPERATIVE DIAGNOSIS:  Right ectopic pregnancy.  POSTOPERATIVE DIAGNOSIS:  Right ectopic pregnancy.  PROCEDURE:  Operative laparoscopy, lysis of adhesions, right salpingectomy for removal of ectopic pregnancy.  SURGEON:  Lovenia Kim, M.D.  ASSISTANTMarland Kitchen  Renato Battles.  ESTIMATED BLOOD LOSS:  Less than 50 mL.  COMPLICATIONS:  None.  ANESTHESIA:  General.  COUNTS:  Correct.  DISPOSITION:  The patient to recovery room in good condition.  FINDINGS:  Markedly enlarged uterus due to known 10 cm fundal fibroid. Normal left tube and ovary.  Normal right ovary and right tube was noted ectopic ampullary.  DESCRIPTION OF PROCEDURE:  After being apprised of risks of anesthesia, infection, bleeding, injury to surrounding organs, possible need for repair, delayed versus immediate complications to include bowel and bladder injury, possible need for repair, the patient was brought to the operating room where she was administered general anesthetic without complications.  Prepped and draped in usual sterile fashion.  Foley catheter placed.  Hulka tenaculum placed.  Vaginal exam under anesthesia revealed markedly enlarged uterus to approximately 16-18 week size and no palpable masses in the adnexa due to the enlarged uterus.  At this time, a supraumbilical incision was made with a scalpel.  Veress needle was placed with an opening pressure of -2, 3-1/2 L CO2 was insufflated without difficulty.  Trocar was placed atraumatically.  Visualization revealed a complete obliteration of the entire pelvis due to this enlarged fibroid.  The ovaries and tubes cannot be visualized  initially. No evidence of any free fluid or blood in the abdomen at this time.  An additional 5-mm port was placed on the right and two 5-mm and 10-mm port on the left.  A Fan retractor was placed to the left lateral port to lift this markedly enlarged fibroid out of the cul-de-sac. Visualization revealed a normal appearing left tube and ovary and a distal right tubal ectopic with bulging bluish appearance of the distal right tube in the ampullary portion.  The right ovary is in the cul-de- sac and appears normal  due to the disorientation of all the pelvic structures from this markedly enlarged fibroid.  At this time, the gyrus was entered from the right.  The mesosalpinx was dissected proximal to the tubal ectopic and the tube was excised distally using the gyrus device.  The ectopic is ruptured during the process with minimal amount of  bleeding.  The specimen was then grasped using an atraumatic grasper placed in an EndoCatch and removed through the camera port.  Good hemostasis was assured.  Upon revisualization, all instruments were removed.  Some periovarian and peritubal adhesions on the right are lysed during the procedure bluntly and sharply.  At this time, good hemostasis was once again assured.  All instruments were removed.  CO2 was released.  Incisions were closed using 0 Vicryl, 4-0 Vicryl, and Dermabond.  The patient  tolerated the procedure well and was transferred to recovery in good condition.     Lovenia Kim, M.D.     RJT/MEDQ  D:  12/14/2014  T:  12/14/2014  Job:  [id number]

## 2015-01-16 ENCOUNTER — Other Ambulatory Visit: Payer: Self-pay | Admitting: Obstetrics and Gynecology

## 2015-01-16 DIAGNOSIS — D259 Leiomyoma of uterus, unspecified: Secondary | ICD-10-CM

## 2015-02-08 ENCOUNTER — Ambulatory Visit
Admission: RE | Admit: 2015-02-08 | Discharge: 2015-02-08 | Disposition: A | Discharge status: Home / Self Care | Payer: BC Managed Care – PPO | Source: Ambulatory Visit | Attending: Obstetrics and Gynecology | Admitting: Obstetrics and Gynecology

## 2015-02-08 DIAGNOSIS — D259 Leiomyoma of uterus, unspecified: Secondary | ICD-10-CM

## 2015-02-08 MED ORDER — GADOBENATE DIMEGLUMINE 529 MG/ML IV SOLN
15.0000 mL | Freq: Once | INTRAVENOUS | Status: AC | PRN
  Administered 2015-02-08: 15 mL via INTRAVENOUS

## 2015-02-15 ENCOUNTER — Other Ambulatory Visit: Payer: Self-pay | Admitting: Obstetrics and Gynecology

## 2015-03-01 NOTE — Anesthesia Preprocedure Evaluation (Addendum)
Anesthesia Evaluation  Patient identified by MRN, date of birth, ID band Patient awake    Reviewed: Allergy & Precautions, H&P , NPO status , Patient's Chart, lab work & pertinent test results  History of Anesthesia Complications Negative for: history of anesthetic complications  Airway Mallampati: II  TM Distance: >3 FB Neck ROM: full    Dental no notable dental hx. (+) Dental Advisory Given   Pulmonary former smoker,  Takes no meds for asthma, denies having it   Pulmonary exam normal breath sounds clear to auscultation       Cardiovascular Exercise Tolerance: Good (-) hypertension(-) angina(-) CAD negative cardio ROS Normal cardiovascular exam Rhythm:regular Rate:Normal     Neuro/Psych negative neurological ROS  negative psych ROS   GI/Hepatic negative GI ROS, Neg liver ROS,   Endo/Other  negative endocrine ROS  Renal/GU negative Renal ROS     Musculoskeletal negative musculoskeletal ROS (+)   Abdominal   Peds  Hematology negative hematology ROS (+)   Anesthesia Other Findings NPO appropriate, allergies reviewed Denies active cardiac or pulmonary symptoms, METS > 4 No recent congestive cough or symptoms of upper respiratory infection    Reproductive/Obstetrics negative OB ROS Large 10cm fibroid                            Anesthesia Physical  Anesthesia Plan  ASA: II  Anesthesia Plan: General   Post-op Pain Management:    Induction: Intravenous  Airway Management Planned: Oral ETT  Additional Equipment:   Intra-op Plan:   Post-operative Plan: Extubation in OR  Informed Consent: I have reviewed the patients History and Physical, chart, labs and discussed the procedure including the risks, benefits and alternatives for the proposed anesthesia with the patient or authorized representative who has indicated his/her understanding and acceptance.   Dental advisory  given  Plan Discussed with: Anesthesiologist, CRNA and Surgeon  Anesthesia Plan Comments: (Risks/benefits of general anesthesia discussed with patient including risk of damage to teeth, lips, gum, and tongue, nausea/vomiting, allergic reactions to medications, and the possibility of heart attack, stroke and death.  All patient questions answered.  Patient wishes to proceed.  2nd IV (Large Fibroid 10cm))       Anesthesia Quick Evaluation

## 2015-03-06 ENCOUNTER — Encounter (HOSPITAL_COMMUNITY): Payer: Self-pay

## 2015-03-06 ENCOUNTER — Encounter (HOSPITAL_COMMUNITY)
Admission: RE | Admit: 2015-03-06 | Discharge: 2015-03-06 | Disposition: A | Discharge status: Home / Self Care | Payer: BC Managed Care – PPO | Source: Ambulatory Visit | Attending: Obstetrics and Gynecology | Admitting: Obstetrics and Gynecology

## 2015-03-06 HISTORY — DX: Gastro-esophageal reflux disease without esophagitis: K21.9

## 2015-03-06 LAB — CBC
HEMATOCRIT: 41.4 % (ref 36.0–46.0)
HEMOGLOBIN: 13.7 g/dL (ref 12.0–15.0)
MCH: 30 pg (ref 26.0–34.0)
MCHC: 33.1 g/dL (ref 30.0–36.0)
MCV: 90.6 fL (ref 78.0–100.0)
Platelets: 230 10*3/uL (ref 150–400)
RBC: 4.57 MIL/uL (ref 3.87–5.11)
RDW: 13.3 % (ref 11.5–15.5)
WBC: 3.1 10*3/uL — ABNORMAL LOW (ref 4.0–10.5)

## 2015-03-06 NOTE — Patient Instructions (Addendum)
Your procedure is scheduled on: March 07, 2015  Enter through the Main Entrance of [facility] at: 8:30 am   Pick up the phone at the desk and dial [phone].  Call this number if you have problems the morning of surgery: [phone].  Remember: Do NOT eat food: after midnight tonight  Do NOT drink clear liquids after: midnight tonight  Take these medicines the morning of surgery with a SIP OF WATER:   Do NOT wear jewelry (body piercing), metal hair clips/bobby pins, make-up, or nail polish. Do NOT wear lotions, powders, or perfumes.  You may wear deoderant. Do NOT shave for 48 hours prior to surgery. Do NOT bring valuables to the hospital. Contacts, dentures, or bridgework may not be worn into surgery. Leave suitcase in car.  After surgery it may be brought to your room.  For patients admitted to the hospital, checkout time is 11:00 AM the day of discharge.

## 2015-03-07 ENCOUNTER — Inpatient Hospital Stay (HOSPITAL_COMMUNITY)
Admission: RE | Admit: 2015-03-07 | Discharge: 2015-03-08 | DRG: 743 | Disposition: A | Discharge status: Home / Self Care | Payer: BC Managed Care – PPO | Source: Ambulatory Visit | Attending: Obstetrics and Gynecology | Admitting: Obstetrics and Gynecology

## 2015-03-07 ENCOUNTER — Inpatient Hospital Stay (HOSPITAL_COMMUNITY): Payer: BC Managed Care – PPO | Admitting: Anesthesiology

## 2015-03-07 ENCOUNTER — Encounter (HOSPITAL_COMMUNITY)
Admission: RE | Disposition: A | Discharge status: Home / Self Care | Payer: Self-pay | Source: Ambulatory Visit | Attending: Obstetrics and Gynecology

## 2015-03-07 ENCOUNTER — Encounter (HOSPITAL_COMMUNITY): Payer: Self-pay

## 2015-03-07 DIAGNOSIS — Z9889 Other specified postprocedural states: Secondary | ICD-10-CM

## 2015-03-07 DIAGNOSIS — Z87891 Personal history of nicotine dependence: Secondary | ICD-10-CM

## 2015-03-07 DIAGNOSIS — E282 Polycystic ovarian syndrome: Secondary | ICD-10-CM | POA: Diagnosis present

## 2015-03-07 DIAGNOSIS — D259 Leiomyoma of uterus, unspecified: Secondary | ICD-10-CM | POA: Diagnosis present

## 2015-03-07 DIAGNOSIS — R102 Pelvic and perineal pain: Secondary | ICD-10-CM | POA: Diagnosis present

## 2015-03-07 HISTORY — PX: MYOMECTOMY: SHX85

## 2015-03-07 LAB — PREPARE RBC (CROSSMATCH)

## 2015-03-07 LAB — PREGNANCY, URINE: Preg Test, Ur: NEGATIVE

## 2015-03-07 SURGERY — MYOMECTOMY, ABDOMINAL APPROACH
Anesthesia: General | Site: Abdomen

## 2015-03-07 MED ORDER — ONDANSETRON HCL 4 MG/2ML IJ SOLN
INTRAMUSCULAR | Status: AC
  Filled 2015-03-07: qty 2

## 2015-03-07 MED ORDER — SODIUM CHLORIDE 0.9 % IJ SOLN: 9.0000 mL | INTRAMUSCULAR | Status: DC | PRN

## 2015-03-07 MED ORDER — PANTOPRAZOLE SODIUM 40 MG PO TBEC
40.0000 mg | DELAYED_RELEASE_TABLET | Freq: Every day | ORAL | Status: DC
  Administered 2015-03-08: 40 mg via ORAL
  Filled 2015-03-07: qty 1

## 2015-03-07 MED ORDER — HYDROMORPHONE HCL 1 MG/ML IJ SOLN
INTRAMUSCULAR | Status: AC
  Filled 2015-03-07: qty 1

## 2015-03-07 MED ORDER — 0.9 % SODIUM CHLORIDE (POUR BTL) OPTIME
TOPICAL | Status: DC | PRN
  Administered 2015-03-07: 2000 mL

## 2015-03-07 MED ORDER — FENTANYL CITRATE (PF) 100 MCG/2ML IJ SOLN: 25.0000 ug | INTRAMUSCULAR | Status: DC | PRN

## 2015-03-07 MED ORDER — MIDAZOLAM HCL 5 MG/5ML IJ SOLN
INTRAMUSCULAR | Status: DC | PRN
  Administered 2015-03-07: 2 mg via INTRAVENOUS

## 2015-03-07 MED ORDER — NALOXONE HCL 0.4 MG/ML IJ SOLN: 0.4000 mg | INTRAMUSCULAR | Status: DC | PRN

## 2015-03-07 MED ORDER — OXYCODONE HCL 5 MG/5ML PO SOLN: 5.0000 mg | Freq: Once | ORAL | Status: AC | PRN

## 2015-03-07 MED ORDER — DEXAMETHASONE SODIUM PHOSPHATE 10 MG/ML IJ SOLN
INTRAMUSCULAR | Status: DC | PRN
  Administered 2015-03-07: 10 mg via INTRAVENOUS

## 2015-03-07 MED ORDER — MIDAZOLAM HCL 2 MG/2ML IJ SOLN
INTRAMUSCULAR | Status: AC
  Filled 2015-03-07: qty 2

## 2015-03-07 MED ORDER — IBUPROFEN 800 MG PO TABS
800.0000 mg | ORAL_TABLET | Freq: Three times a day (TID) | ORAL | Status: DC | PRN
  Administered 2015-03-07 – 2015-03-08 (×2): 800 mg via ORAL
  Filled 2015-03-07 (×2): qty 1

## 2015-03-07 MED ORDER — ROCURONIUM BROMIDE 100 MG/10ML IV SOLN
INTRAVENOUS | Status: AC
  Filled 2015-03-07: qty 1

## 2015-03-07 MED ORDER — KETOROLAC TROMETHAMINE 30 MG/ML IJ SOLN
INTRAMUSCULAR | Status: AC
  Filled 2015-03-07: qty 1

## 2015-03-07 MED ORDER — PROMETHAZINE HCL 25 MG/ML IJ SOLN: 6.2500 mg | INTRAMUSCULAR | Status: DC | PRN

## 2015-03-07 MED ORDER — DEXAMETHASONE SODIUM PHOSPHATE 10 MG/ML IJ SOLN
INTRAMUSCULAR | Status: AC
Start: 2015-03-07 — End: 2015-03-07
  Filled 2015-03-07: qty 1

## 2015-03-07 MED ORDER — DIPHENHYDRAMINE HCL 50 MG/ML IJ SOLN: 12.5000 mg | Freq: Four times a day (QID) | INTRAMUSCULAR | Status: DC | PRN

## 2015-03-07 MED ORDER — SCOPOLAMINE 1 MG/3DAYS TD PT72
MEDICATED_PATCH | TRANSDERMAL | Status: AC
Start: 2015-03-07 — End: 2015-03-07
  Filled 2015-03-07: qty 1

## 2015-03-07 MED ORDER — DEXTROSE IN LACTATED RINGERS 5 % IV SOLN: INTRAVENOUS | Status: DC

## 2015-03-07 MED ORDER — FENTANYL CITRATE (PF) 100 MCG/2ML IJ SOLN
INTRAMUSCULAR | Status: DC | PRN
  Administered 2015-03-07: 100 ug via INTRAVENOUS
  Administered 2015-03-07: 150 ug via INTRAVENOUS

## 2015-03-07 MED ORDER — KETOROLAC TROMETHAMINE 30 MG/ML IJ SOLN: 30.0000 mg | Freq: Four times a day (QID) | INTRAMUSCULAR | Status: DC

## 2015-03-07 MED ORDER — OXYCODONE HCL 5 MG PO TABS
5.0000 mg | ORAL_TABLET | Freq: Once | ORAL | Status: AC | PRN
  Administered 2015-03-08: 5 mg via ORAL
  Filled 2015-03-07: qty 1

## 2015-03-07 MED ORDER — MENTHOL 3 MG MT LOZG: 1.0000 | LOZENGE | OROMUCOSAL | Status: DC | PRN

## 2015-03-07 MED ORDER — PROPOFOL 10 MG/ML IV BOLUS
INTRAVENOUS | Status: AC
  Filled 2015-03-07: qty 20

## 2015-03-07 MED ORDER — GLYCOPYRROLATE 0.2 MG/ML IJ SOLN
INTRAMUSCULAR | Status: DC | PRN
  Administered 2015-03-07: .8 mg via INTRAVENOUS

## 2015-03-07 MED ORDER — ONDANSETRON HCL 4 MG/2ML IJ SOLN
INTRAMUSCULAR | Status: DC | PRN
  Administered 2015-03-07: 4 mg via INTRAVENOUS

## 2015-03-07 MED ORDER — FENTANYL CITRATE (PF) 250 MCG/5ML IJ SOLN
INTRAMUSCULAR | Status: AC
  Filled 2015-03-07: qty 5

## 2015-03-07 MED ORDER — OXYCODONE-ACETAMINOPHEN 5-325 MG PO TABS
1.0000 | ORAL_TABLET | ORAL | Status: DC | PRN
  Administered 2015-03-08 (×2): 1 via ORAL
  Filled 2015-03-07 (×2): qty 1

## 2015-03-07 MED ORDER — LIDOCAINE HCL (CARDIAC) 20 MG/ML IV SOLN
INTRAVENOUS | Status: AC
  Filled 2015-03-07: qty 5

## 2015-03-07 MED ORDER — LACTATED RINGERS IV SOLN
INTRAVENOUS | Status: DC
  Administered 2015-03-07 (×4): via INTRAVENOUS

## 2015-03-07 MED ORDER — SCOPOLAMINE 1 MG/3DAYS TD PT72: 1.0000 | MEDICATED_PATCH | Freq: Once | TRANSDERMAL | Status: DC

## 2015-03-07 MED ORDER — MEPERIDINE HCL 25 MG/ML IJ SOLN
6.2500 mg | INTRAMUSCULAR | Status: DC | PRN
Start: 2015-03-07 — End: 2015-03-08

## 2015-03-07 MED ORDER — PHENYLEPHRINE HCL 10 MG/ML IJ SOLN
INTRAMUSCULAR | Status: DC | PRN
  Administered 2015-03-07: 80 ug via INTRAVENOUS

## 2015-03-07 MED ORDER — CEFAZOLIN SODIUM-DEXTROSE 2-3 GM-% IV SOLR
INTRAVENOUS | Status: AC
Start: 2015-03-07 — End: 2015-03-07
  Administered 2015-03-07: 2 g via INTRAVENOUS
  Filled 2015-03-07: qty 50

## 2015-03-07 MED ORDER — KETOROLAC TROMETHAMINE 30 MG/ML IJ SOLN
30.0000 mg | Freq: Four times a day (QID) | INTRAMUSCULAR | Status: DC
  Administered 2015-03-07: 30 mg via INTRAVENOUS
  Filled 2015-03-07 (×2): qty 1

## 2015-03-07 MED ORDER — ROCURONIUM BROMIDE 100 MG/10ML IV SOLN
INTRAVENOUS | Status: DC | PRN
  Administered 2015-03-07: 20 mg via INTRAVENOUS
  Administered 2015-03-07: 50 mg via INTRAVENOUS

## 2015-03-07 MED ORDER — KETOROLAC TROMETHAMINE 30 MG/ML IJ SOLN
INTRAMUSCULAR | Status: DC | PRN
  Administered 2015-03-07: 30 mg via INTRAVENOUS

## 2015-03-07 MED ORDER — DIPHENHYDRAMINE HCL 12.5 MG/5ML PO ELIX: 12.5000 mg | ORAL_SOLUTION | Freq: Four times a day (QID) | ORAL | Status: DC | PRN

## 2015-03-07 MED ORDER — CEFAZOLIN SODIUM-DEXTROSE 2-3 GM-% IV SOLR: 2.0000 g | INTRAVENOUS | Status: DC

## 2015-03-07 MED ORDER — PROPOFOL 10 MG/ML IV BOLUS
INTRAVENOUS | Status: DC | PRN
  Administered 2015-03-07: 160 mg via INTRAVENOUS

## 2015-03-07 MED ORDER — ONDANSETRON HCL 4 MG PO TABS: 4.0000 mg | ORAL_TABLET | Freq: Four times a day (QID) | ORAL | Status: DC | PRN

## 2015-03-07 MED ORDER — ONDANSETRON HCL 4 MG/2ML IJ SOLN: 4.0000 mg | Freq: Four times a day (QID) | INTRAMUSCULAR | Status: DC | PRN

## 2015-03-07 MED ORDER — NEOSTIGMINE METHYLSULFATE 10 MG/10ML IV SOLN
INTRAVENOUS | Status: DC | PRN
  Administered 2015-03-07: 5 mg via INTRAVENOUS

## 2015-03-07 MED ORDER — HYDROMORPHONE HCL 1 MG/ML IJ SOLN
0.2000 mg | INTRAMUSCULAR | Status: DC | PRN
  Administered 2015-03-07: 0.5 mg via INTRAVENOUS
  Filled 2015-03-07: qty 1

## 2015-03-07 MED ORDER — BUPIVACAINE HCL (PF) 0.25 % IJ SOLN
INTRAMUSCULAR | Status: AC
  Filled 2015-03-07: qty 30

## 2015-03-07 MED ORDER — PROMETHAZINE HCL 25 MG/ML IJ SOLN: 12.5000 mg | Freq: Once | INTRAMUSCULAR | Status: DC | PRN

## 2015-03-07 MED ORDER — HYDROMORPHONE HCL 1 MG/ML IJ SOLN
INTRAMUSCULAR | Status: DC | PRN
  Administered 2015-03-07 (×2): 1 mg via INTRAVENOUS

## 2015-03-07 MED ORDER — GLYCOPYRROLATE 0.2 MG/ML IJ SOLN
INTRAMUSCULAR | Status: AC
  Filled 2015-03-07: qty 4

## 2015-03-07 MED ORDER — BUPIVACAINE HCL (PF) 0.25 % IJ SOLN
INTRAMUSCULAR | Status: DC | PRN
  Administered 2015-03-07: 7 mL

## 2015-03-07 MED ORDER — SIMETHICONE 80 MG PO CHEW
80.0000 mg | CHEWABLE_TABLET | Freq: Four times a day (QID) | ORAL | Status: DC | PRN
  Administered 2015-03-08: 80 mg via ORAL
  Filled 2015-03-07: qty 1

## 2015-03-07 MED ORDER — LIDOCAINE HCL (CARDIAC) 20 MG/ML IV SOLN
INTRAVENOUS | Status: DC | PRN
  Administered 2015-03-07: 50 mg via INTRAVENOUS

## 2015-03-07 MED ORDER — SODIUM CHLORIDE 0.9 % IV SOLN
INTRAVENOUS | Status: DC | PRN
  Administered 2015-03-07: 22 mL via INTRAMUSCULAR

## 2015-03-07 SURGICAL SUPPLY — 43 items
BARRIER ADHS 3X4 INTERCEED (GAUZE/BANDAGES/DRESSINGS) ×6 IMPLANT
BENZOIN TINCTURE PRP APPL 2/3 (GAUZE/BANDAGES/DRESSINGS) ×3 IMPLANT
CANISTER SUCT 3000ML (MISCELLANEOUS) ×3 IMPLANT
CLOSURE WOUND 1/2 X4 (GAUZE/BANDAGES/DRESSINGS) ×1
CLOTH BEACON ORANGE TIMEOUT ST (SAFETY) ×3 IMPLANT
CONT PATH 16OZ SNAP LID 3702 (MISCELLANEOUS) ×3 IMPLANT
CONT SPEC PATH 64OZ SNAP LID (MISCELLANEOUS) ×3 IMPLANT
DECANTER SPIKE VIAL GLASS SM (MISCELLANEOUS) ×9 IMPLANT
DRAPE CESAREAN BIRTH W POUCH (DRAPES) ×3 IMPLANT
DRSG OPSITE POSTOP 4X10 (GAUZE/BANDAGES/DRESSINGS) ×3 IMPLANT
DURAPREP 26ML APPLICATOR (WOUND CARE) ×3 IMPLANT
ELECT NEEDLE TIP 2.8 STRL (NEEDLE) ×3 IMPLANT
FILTER STRAW FLUID ASPIR (MISCELLANEOUS) IMPLANT
GAUZE SPONGE 4X4 16PLY XRAY LF (GAUZE/BANDAGES/DRESSINGS) ×3 IMPLANT
GLOVE BIOGEL PI IND STRL 7.0 (GLOVE) ×3 IMPLANT
GLOVE BIOGEL PI INDICATOR 7.0 (GLOVE) ×6
GLOVE ECLIPSE 6.5 STRL STRAW (GLOVE) ×3 IMPLANT
GOWN STRL REUS W/TWL LRG LVL3 (GOWN DISPOSABLE) ×9 IMPLANT
NEEDLE HYPO 22GX1.5 SAFETY (NEEDLE) ×3 IMPLANT
NEEDLE SPNL 22GX3.5 QUINCKE BK (NEEDLE) ×3 IMPLANT
NS IRRIG 1000ML POUR BTL (IV SOLUTION) ×3 IMPLANT
PACK ABDOMINAL GYN (CUSTOM PROCEDURE TRAY) ×3 IMPLANT
PAD OB MATERNITY 4.3X12.25 (PERSONAL CARE ITEMS) ×3 IMPLANT
RETRACTOR WND ALEXIS 25 LRG (MISCELLANEOUS) ×1 IMPLANT
RTRCTR WOUND ALEXIS 25CM LRG (MISCELLANEOUS) ×3
SPONGE GAUZE 4X4 12PLY STER LF (GAUZE/BANDAGES/DRESSINGS) ×3 IMPLANT
STAPLER VISISTAT 35W (STAPLE) IMPLANT
STRIP CLOSURE SKIN 1/2X4 (GAUZE/BANDAGES/DRESSINGS) ×2 IMPLANT
SUT MON AB 3-0 SH 27 (SUTURE) ×6
SUT MON AB 3-0 SH27 (SUTURE) ×3 IMPLANT
SUT MON AB 4-0 PS1 27 (SUTURE) ×6 IMPLANT
SUT PLAIN 2 0 XLH (SUTURE) ×3 IMPLANT
SUT VIC AB 0 CT1 18XCR BRD8 (SUTURE) ×2 IMPLANT
SUT VIC AB 0 CT1 36 (SUTURE) ×6 IMPLANT
SUT VIC AB 0 CT1 8-18 (SUTURE) ×4
SUT VIC AB 2-0 CT1 27 (SUTURE) ×2
SUT VIC AB 2-0 CT1 TAPERPNT 27 (SUTURE) ×1 IMPLANT
SUT VIC AB 2-0 SH 27 (SUTURE) ×4
SUT VIC AB 2-0 SH 27XBRD (SUTURE) ×2 IMPLANT
SUT VIC AB 4-0 PS2 27 (SUTURE) ×3 IMPLANT
SYR CONTROL 10ML LL (SYRINGE) ×6 IMPLANT
TOWEL OR 17X24 6PK STRL BLUE (TOWEL DISPOSABLE) ×6 IMPLANT
TRAY FOLEY CATH SILVER 14FR (SET/KITS/TRAYS/PACK) ×3 IMPLANT

## 2015-03-07 NOTE — Anesthesia Postprocedure Evaluation (Signed)
Anesthesia Post Note  Patient: Shannon Davenport  Procedure(s) Performed: Procedure(s) (LRB): Exploratory Laparotomy MYOMECTOMY (N/A)  Patient location during evaluation: PACU Anesthesia Type: General Level of consciousness: awake and alert Pain management: pain level controlled Vital Signs Assessment: post-procedure vital signs reviewed and stable Respiratory status: spontaneous breathing, nonlabored ventilation, respiratory function stable and patient connected to nasal cannula oxygen Cardiovascular status: blood pressure returned to baseline and stable Postop Assessment: no signs of nausea or vomiting Anesthetic complications: no    Last Vitals:  Filed Vitals:   03/07/15 1400 03/07/15 1415  BP: 122/68 128/70  Pulse: 84 86  Temp:    Resp: 14 20    Last Pain: There were no vitals filed for this visit.               Zenaida Deed

## 2015-03-07 NOTE — Transfer of Care (Signed)
Immediate Anesthesia Transfer of Care Note  Patient: Shannon Davenport  Procedure(s) Performed: Procedure(s) with comments: Exploratory Laparotomy MYOMECTOMY (N/A) - 90 min.  Patient Location: PACU  Anesthesia Type:General  Level of Consciousness: awake, alert  and oriented  Airway & Oxygen Therapy: Patient Spontanous Breathing and Patient connected to nasal cannula oxygen  Post-op Assessment: Report given to RN and Post -op Vital signs reviewed and stable  Post vital signs: Reviewed and stable  Last Vitals:  Filed Vitals:   03/07/15 0849  BP: 123/66  Pulse: 72  Temp: 37.1 C  Resp: 18    Complications: No apparent anesthesia complications

## 2015-03-07 NOTE — Addendum Note (Signed)
Addendum  created 03/07/15 1551 by Brock Ra, CRNA   Modules edited: Clinical Notes   Clinical Notes:  File: [id number]

## 2015-03-07 NOTE — Brief Op Note (Signed)
03/07/2015  1:02 PM  PATIENT:  Shannon Davenport  34 y.o. female  PRE-OPERATIVE DIAGNOSIS:  Large Uterine Fibroid, pelvic pain  POST-OPERATIVE DIAGNOSIS:  Large Uterine Fibroid( IM/SM fibroid), pelvic pain  PROCEDURE:  Exploratory laparotomy, myomectomy  SURGEON:  Surgeon(s) and Role:    * Servando Salina, MD - Primary  PHYSICIAN ASSISTANT:   ASSISTANTS: Julianne Handler, CNM   ANESTHESIA:   general FINDINGS;  NL left tube, bilateral polycystic ovaries, large left anteriolateral 10 cm fibroid abutting endometrium EBL:  Total I/O In: 2500 [I.V.:2500] Out: 175 [Urine:150; Blood:25]  BLOOD ADMINISTERED:none  DRAINS: none   LOCAL MEDICATIONS USED:  MARCAINE     SPECIMEN:  Source of Specimen:  myoma  DISPOSITION OF SPECIMEN:  PATHOLOGY  COUNTS:  YES  TOURNIQUET:  * No tourniquets in log *  DICTATION: .Other Dictation: Dictation Number [id number]  PLAN OF CARE: Admit to inpatient   PATIENT DISPOSITION:  PACU - hemodynamically stable.   Delay start of Pharmacological VTE agent (>24hrs) due to surgical blood loss or risk of bleeding: no

## 2015-03-07 NOTE — Anesthesia Postprocedure Evaluation (Signed)
Anesthesia Post Note  Patient: Shannon Davenport  Procedure(s) Performed: Procedure(s) (LRB): Exploratory Laparotomy MYOMECTOMY (N/A)  Patient location during evaluation: [facility] Anesthesia Type: General Level of consciousness: awake, awake and alert, oriented and patient cooperative Pain management: pain level controlled Vital Signs Assessment: post-procedure vital signs reviewed and stable Respiratory status: spontaneous breathing and respiratory function stable Cardiovascular status: blood pressure returned to baseline and stable Postop Assessment: no headache, no backache, patient able to bend at knees, no signs of nausea or vomiting and adequate PO intake Anesthetic complications: no    Last Vitals:  Filed Vitals:   03/07/15 1415 03/07/15 1503  BP: 128/70 123/73  Pulse: 86 87  Temp:  36.9 C  Resp: 20 14    Last Pain:  Filed Vitals:   03/07/15 1504  PainSc: 6                  Shannon Davenport

## 2015-03-07 NOTE — Anesthesia Procedure Notes (Signed)
Procedure Name: Intubation Date/Time: 03/07/2015 11:17 AM Performed by: Riki Sheer Pre-anesthesia Checklist: Patient identified, Emergency Drugs available, Suction available, Patient being monitored and Timeout performed Patient Re-evaluated:Patient Re-evaluated prior to inductionOxygen Delivery Method: Circle system utilized Preoxygenation: Pre-oxygenation with 100% oxygen Intubation Type: IV induction Ventilation: Mask ventilation without difficulty Laryngoscope Size: Mac and 3 Grade View: Grade I Tube type: Oral Tube size: 7.0 mm Number of attempts: 1 Airway Equipment and Method: Stylet Secured at: 21 cm Tube secured with: Tape Dental Injury: Teeth and Oropharynx as per pre-operative assessment

## 2015-03-08 ENCOUNTER — Encounter (HOSPITAL_COMMUNITY): Payer: Self-pay | Admitting: Obstetrics and Gynecology

## 2015-03-08 LAB — CBC
HCT: 34.9 % — ABNORMAL LOW (ref 36.0–46.0)
Hemoglobin: 11.6 g/dL — ABNORMAL LOW (ref 12.0–15.0)
MCH: 30 pg (ref 26.0–34.0)
MCHC: 33.2 g/dL (ref 30.0–36.0)
MCV: 90.2 fL (ref 78.0–100.0)
PLATELETS: 191 10*3/uL (ref 150–400)
RBC: 3.87 MIL/uL (ref 3.87–5.11)
RDW: 13.5 % (ref 11.5–15.5)
WBC: 6.6 10*3/uL (ref 4.0–10.5)

## 2015-03-08 MED ORDER — OXYCODONE-ACETAMINOPHEN 5-325 MG PO TABS: 1.0000 | ORAL_TABLET | ORAL | Status: DC | PRN

## 2015-03-08 MED ORDER — IBUPROFEN 800 MG PO TABS: 800.0000 mg | ORAL_TABLET | Freq: Three times a day (TID) | ORAL | Status: DC | PRN

## 2015-03-08 NOTE — Progress Notes (Signed)
Subjective: Patient reports tolerating PO, + flatus and no problems voiding.    Objective: I have reviewed patient's vital signs.  vital signs, intake and output and labs. Filed Vitals:   03/08/15 0116 03/08/15 0548  BP: 109/60 112/52  Pulse: 75 72  Temp: 98.4 F (36.9 C) 98.2 F (36.8 C)  Resp: 16 18   I/O last 3 completed shifts: In: 4776.7 [P.O.:980; I.V.:3796.7] Out: 2670 [Urine:2645; Blood:25]    Lab Results  Component Value Date   WBC 6.6 03/08/2015   HGB 11.6* 03/08/2015   HCT 34.9* 03/08/2015   MCV 90.2 03/08/2015   PLT 191 03/08/2015   Lab Results  Component Value Date   CREATININE 0.7 02/05/2011    EXAM General: alert, cooperative and no distress Resp: clear to auscultation bilaterally Cardio: regular rate and rhythm, S1, S2 normal, no murmur, click, rub or gallop GI: soft, non-tender; bowel sounds normal; no masses,  no organomegaly and incision: clean, dry and intact Extremities: no edema, redness or tenderness in the calves or thighs Vaginal Bleeding: minimal  Assessment: s/p Procedure(s): Exploratory Laparotomy MYOMECTOMY: stable, progressing well and tolerating diet  Plan: Encourage ambulation Discontinue IV fluids Discharge home later pm if remains stable  LOS: 1 day    Roe Koffman A, MD 03/08/2015 8:26 AM    03/08/2015, 8:26 AM

## 2015-03-08 NOTE — Progress Notes (Signed)
I spent time with Ms Luevano at her RN's referral.  She experienced an ectopic pregnancy in September and is hoping to be able to conceive soon.  She shared and processed some about her experience and the meaning that she is taking from it.  She also shared about her hopes for the future and her positive outlook.  She is coping well and has good resources for support.  She was very receptive to talking and used the visit to help her continue to heal.  Chaplain Janne Napoleon, [place] Pager, [phone] 2:57 PM    03/08/15 1400  Clinical Encounter Type  Visited With Patient  Visit Type Spiritual support  Referral From (Hx of recent ectopic)

## 2015-03-08 NOTE — Progress Notes (Signed)
Patient discharged home with significant other... Discharge instructions reviewed with patient and she verbalized understanding... Condition stable... No equipment... Ambulated to car with E. Rylie Knierim, RN.  

## 2015-03-08 NOTE — Discharge Summary (Signed)
Physician Discharge Summary  Patient ID: Shannon Davenport MRN: [id number] DOB/AGE: 1980/04/27 34 y.o.  Admit date: 03/07/2015 Discharge date: 03/08/2015  Admission Diagnoses: large uterine fibroid, pelvic pain  Discharge Diagnoses: IM/SM fibroid, pelvic pain Active Problems:   S/P myomectomy   Discharged Condition: stable  Hospital Course: pt was admitted to [facility] where she underwent exp lap, myomectomy. Pt had uncomplicated  Postoperative course  Consults: None  Significant Diagnostic Studies:  CBC Latest Ref Rng 03/08/2015 03/06/2015 12/13/2014  WBC 4.0 - 10.5 K/uL 6.6 3.1(L) 7.0  Hemoglobin 12.0 - 15.0 g/dL 11.6(L) 13.7 12.5  Hematocrit 36.0 - 46.0 % 34.9(L) 41.4 37.5  Platelets 150 - 400 K/uL 191 230 217       Treatments: surgery: exp lap, myomectomy  Discharge Exam: Blood pressure 127/70, pulse 68, temperature 98.3 F (36.8 C), temperature source Oral, resp. rate 18, height 5\' 7"  (1.702 m), weight 75.297 kg (166 lb), last menstrual period 02/26/2015, SpO2 98 %. General appearance: alert, cooperative and no distress Resp: clear to auscultation bilaterally Breasts: normal appearance, no masses or tenderness Cardio: regular rate and rhythm, S1, S2 normal, no murmur, click, rub or gallop GI: soft, non-tender; bowel sounds normal; no masses,  no organomegaly and incision: clean/dry/intact Pelvic: deferred Extremities: no edema, redness or tenderness in the calves or thighs  Disposition: 01-Home or Self Care  Discharge Instructions     Remove dressing in 72 hours    Complete by:  As directed      Diet general    Complete by:  As directed      Discharge instructions    Complete by:  As directed   Call if temperature greater than equal to 100.4, nothing per vagina for 4-6 weeks or severe nausea vomiting, increased incisional pain , drainage or redness in the incision site, no straining with bowel movements, showers no bath     Discharge patient    Complete by:  As  directed      Increase activity slowly    Complete by:  As directed      No wound care    Complete by:  As directed             Medication List    TAKE these medications        acetaminophen 500 MG tablet  Commonly known as:  TYLENOL  Take 1,000 mg by mouth every 6 (six) hours as needed for headache.     ibuprofen 800 MG tablet  Commonly known as:  ADVIL,MOTRIN  Take 1 tablet (800 mg total) by mouth every 8 (eight) hours as needed (mild pain).     oxyCODONE-acetaminophen 5-325 MG tablet  Commonly known as:  PERCOCET/ROXICET  Take 1-2 tablets by mouth every 4 (four) hours as needed for severe pain (moderate to severe pain (when tolerating fluids)).     prenatal multivitamin Tabs tablet  Take 1 tablet by mouth daily at 12 noon.     valACYclovir 500 MG tablet  Commonly known as:  VALTREX  Take 1,000 mg by mouth 2 (two) times daily as needed (for outbreaks).           Follow-up Information    Follow up with Ringo Sherod A, MD On 04/02/2015.   Specialty:  Obstetrics and Gynecology   Contact information:   [address] [place] [address] [phone]       Signed: Servando Salina A 03/08/2015, 6:30 PM

## 2015-03-08 NOTE — Op Note (Signed)
NAME:  Shannon Davenport, Shannon Davenport          ACCOUNT NO.:  [id number]  MEDICAL RECORD NO.:  [id number]  LOCATION:  [id number]                          FACILITY:  [facility]  PHYSICIAN:  Servando Salina, M.D.DATE OF BIRTH:  05-10-1980  DATE OF PROCEDURE:  03/07/2015 DATE OF DISCHARGE:                              OPERATIVE REPORT   PREOPERATIVE DIAGNOSIS:  Large fibroid uterus, pelvic pain.  PROCEDURE:  Exploratory laparotomy, myomectomy.  POSTOPERATIVE DIAGNOSIS:  Intramural/submucosal fibroid, pelvic pain.  ANESTHESIA:  General.  SURGEON:  Servando Salina, M.D.  ASSISTANT:  Julianne Handler, CNM.  DESCRIPTION OF PROCEDURE:  Under adequate general anesthesia, the patient was placed in the supine position.  She was sterilely prepped and draped in the usual fashion.  An indwelling Foley catheter was sterilely placed, a 0.25% Marcaine was injected along the planned Pfannenstiel skin incision site.  Pfannenstiel skin incision was then made, carried down to the rectus fascia.  Rectus fascia was opened transversely.  The rectus fascia was then bluntly and sharply dissected off the rectus muscle in superior and inferior fashion.  The rectus muscle was split in midline.  The parietal peritoneum was entered bluntly and extended.  Examination of the abdomen reveals a normal liver edge, normal palpable kidneys, uterus that was mobile and distorted to its left side due to large anterior fibroid.  Initial attempts at exteriorizing the fibroid and uterus was unsuccessful.  A self-retaining Balfour retractor was then placed.   Polycystic-like ovaries, surgically absent right tube, and normal left tube was noticed.  The diluted solution of Pitressin was injected over the serosa of the anterior fibroid.  A small vertical incision was made at that site, which was then extended to  The capsule of the fibroid. Single tooth tenaculum was then used to pull the uterus and fibroid externally. The fibroid was enucleated  from its location. The fibroid dead space was closed with interrupted figure of eights 0 Vicryl sutures. 3-0 Monocryl baseball suturing was used to approximate the serosa.  The abdomen was then irrigated and suctioned.  Uterus was returned back to the abdomen.  Interceed was placed over the 2 incision site.  The self-retaining Balfour retractor was carefully removed.  The parietal peritoneum was then closed with 2-0 Vicryl.  The rectus fascia was closed with 0 Vicryl x2.  The subcutaneous area was irrigated, small bleeders cauterized, and subcuticular closure using 4-0 Vicryl suture was then performed with Steri-Strips placed.  SPECIMEN:  Myoma sent to Pathology.  ESTIMATED BLOOD LOSS:  25.  INTRAOPERATIVE FLUID:  2500.  URINE OUTPUT:  150 mL clear urine.  COUNTS:  Sponge and instrument counts x2 was correct.  COMPLICATION:  None.  The patient tolerated the procedure well, was transferred to the recovery in stable condition.     Servando Salina, M.D.     [place]/MEDQ  D:  03/07/2015  T:  03/08/2015  Job:  [id number]

## 2015-03-09 LAB — TYPE AND SCREEN
ABO/RH(D): O POS
Antibody Screen: NEGATIVE
Unit division: 0
Unit division: 0

## 2015-06-12 ENCOUNTER — Other Ambulatory Visit (HOSPITAL_COMMUNITY): Payer: Self-pay | Admitting: Obstetrics and Gynecology

## 2015-06-12 DIAGNOSIS — Z9889 Other specified postprocedural states: Secondary | ICD-10-CM

## 2015-06-18 ENCOUNTER — Ambulatory Visit (HOSPITAL_COMMUNITY)
Admission: RE | Admit: 2015-06-18 | Discharge: 2015-06-18 | Disposition: A | Discharge status: Home / Self Care | Payer: BC Managed Care – PPO | Source: Ambulatory Visit | Attending: Obstetrics and Gynecology | Admitting: Obstetrics and Gynecology

## 2015-06-18 ENCOUNTER — Encounter (HOSPITAL_COMMUNITY): Payer: Self-pay

## 2015-06-18 DIAGNOSIS — Z09 Encounter for follow-up examination after completed treatment for conditions other than malignant neoplasm: Secondary | ICD-10-CM | POA: Diagnosis not present

## 2015-06-18 DIAGNOSIS — Z9889 Other specified postprocedural states: Secondary | ICD-10-CM | POA: Insufficient documentation

## 2015-06-18 MED ORDER — IOPAMIDOL (ISOVUE-300) INJECTION 61%
30.0000 mL | Freq: Once | INTRAVENOUS | Status: AC | PRN
  Administered 2015-06-18: 8 mL

## 2015-11-19 ENCOUNTER — Encounter: Payer: Self-pay | Admitting: Internal Medicine

## 2015-11-19 ENCOUNTER — Ambulatory Visit (INDEPENDENT_AMBULATORY_CARE_PROVIDER_SITE_OTHER): Payer: BC Managed Care – PPO | Admitting: Internal Medicine

## 2015-11-19 VITALS — BP 130/88 | HR 89 | Temp 98.6°F | Resp 16 | Ht 67.0 in | Wt 172.5 lb

## 2015-11-19 DIAGNOSIS — J301 Allergic rhinitis due to pollen: Secondary | ICD-10-CM

## 2015-11-19 DIAGNOSIS — B001 Herpesviral vesicular dermatitis: Secondary | ICD-10-CM | POA: Diagnosis not present

## 2015-11-19 MED ORDER — AZELASTINE-FLUTICASONE 137-50 MCG/ACT NA SUSP: 2.0000 | Freq: Two times a day (BID) | NASAL | 11 refills | Status: DC

## 2015-11-19 MED ORDER — VALACYCLOVIR HCL 500 MG PO TABS: 1000.0000 mg | ORAL_TABLET | Freq: Two times a day (BID) | ORAL | 1 refills | Status: DC | PRN

## 2015-11-19 MED ORDER — LEVOCETIRIZINE DIHYDROCHLORIDE 5 MG PO TABS: 5.0000 mg | ORAL_TABLET | Freq: Every evening | ORAL | 1 refills | Status: DC

## 2015-11-19 NOTE — Progress Notes (Signed)
Pre visit review using our clinic review tool, if applicable. No additional management support is needed unless otherwise documented below in the visit note. 

## 2015-11-19 NOTE — Progress Notes (Signed)
Subjective:  Patient ID: Shannon Davenport, female    DOB: Jun 30, 1980  Age: 35 y.o. MRN: [id number]  CC: Allergic Rhinitis    HPI Shannon Davenport presents for a one-week history of postnasal drip, nasal congestion, runny nose, non-productive cough, and a tickle in her throat. She denies sore throat, fever, chills, facial pain, earache, rash, or lymphadenopathy. She has been taking TheraFlu and has not gotten much symptom relief.   Outpatient Medications Prior to Visit  Medication Sig Dispense Refill  . Prenatal Vit-Fe Fumarate-FA (PRENATAL MULTIVITAMIN) TABS tablet Take 1 tablet by mouth daily at 12 noon.    Marland Kitchen acetaminophen (TYLENOL) 500 MG tablet Take 1,000 mg by mouth every 6 (six) hours as needed for headache.    . ibuprofen (ADVIL,MOTRIN) 800 MG tablet Take 1 tablet (800 mg total) by mouth every 8 (eight) hours as needed (mild pain). 30 tablet 0  . oxyCODONE-acetaminophen (PERCOCET/ROXICET) 5-325 MG tablet Take 1-2 tablets by mouth every 4 (four) hours as needed for severe pain (moderate to severe pain (when tolerating fluids)). 30 tablet 0  . valACYclovir (VALTREX) 500 MG tablet Take 1,000 mg by mouth 2 (two) times daily as needed (for outbreaks).     No facility-administered medications prior to visit.     ROS Review of Systems  Constitutional: Negative.  Negative for activity change, chills, diaphoresis, fatigue, fever and unexpected weight change.  HENT: Positive for congestion, postnasal drip and rhinorrhea. Negative for ear discharge, ear pain, facial swelling, nosebleeds, sinus pressure, sneezing, sore throat, tinnitus, trouble swallowing and voice change.   Eyes: Negative.  Negative for visual disturbance.  Respiratory: Positive for cough. Negative for apnea, choking, chest tightness, shortness of breath, wheezing and stridor.   Cardiovascular: Negative.  Negative for chest pain, palpitations and leg swelling.  Gastrointestinal: Negative.  Negative for abdominal  pain, blood in stool, constipation, diarrhea, nausea and vomiting.  Endocrine: Negative.   Genitourinary: Negative.   Musculoskeletal: Negative.   Skin: Negative.        She occasionally gets cold sores around her lips  Allergic/Immunologic: Negative.   Neurological: Negative.   Hematological: Negative.  Negative for adenopathy. Does not bruise/bleed easily.  Psychiatric/Behavioral: Negative.     Objective:  BP 130/88 (BP Location: Left Arm, Patient Position: Sitting, Cuff Size: Normal)   Pulse 89   Temp 98.6 F (37 C) (Oral)   Resp 16   Ht 5\' 7"  (1.702 m)   Wt 172 lb 8 oz (78.2 kg)   LMP 11/10/2015   SpO2 98%   BMI 27.02 kg/m   BP Readings from Last 3 Encounters:  11/19/15 130/88  03/08/15 127/70  03/06/15 115/73    Wt Readings from Last 3 Encounters:  11/19/15 172 lb 8 oz (78.2 kg)  03/07/15 166 lb (75.3 kg)  03/06/15 166 lb 6 oz (75.5 kg)    Physical Exam  HENT:  Nose: Mucosal edema and rhinorrhea present. No sinus tenderness or nasal deformity. Right sinus exhibits no maxillary sinus tenderness and no frontal sinus tenderness. Left sinus exhibits no maxillary sinus tenderness and no frontal sinus tenderness.  Mouth/Throat: Oropharynx is clear and moist and mucous membranes are normal. Mucous membranes are not pale, not dry and not cyanotic. No oral lesions. No trismus in the jaw. No uvula swelling. No oropharyngeal exudate, posterior oropharyngeal edema, posterior oropharyngeal erythema or tonsillar abscesses.    Lab Results  Component Value Date   WBC 6.6 03/08/2015   HGB 11.6 (L) 03/08/2015   HCT  34.9 (L) 03/08/2015   PLT 191 03/08/2015   GLUCOSE 80 02/05/2011   CHOL 168 02/05/2011   TRIG 57.0 02/05/2011   HDL 61.30 02/05/2011   LDLCALC 95 02/05/2011   ALT 18 02/05/2011   AST 26 02/05/2011   NA 138 02/05/2011   K 3.8 02/05/2011   CL 103 02/05/2011   CREATININE 0.7 02/05/2011   BUN 15 02/05/2011   CO2 27 02/05/2011   TSH 0.60 02/05/2011    Dg  Hysterogram (hsg)  Result Date: 06/18/2015 CLINICAL DATA:  Recent right salpingectomy for ectopic pregnancy and myomectomy for uterine fibroids. Evaluate tubal patency. EXAM: HYSTEROSALPINGOGRAM TECHNIQUE: Following cleansing of the cervix and vagina with Betadine solution, a hysterosalpingogram was performed using a 5-French hysterosalpingogram catheter and Omnipaque 300 contrast. The patient tolerated the examination without difficulty. COMPARISON:  None. FLUOROSCOPY TIME:  Fluoroscopy Time:  1 minutes 36 seconds Number of Acquired Images:  9 FINDINGS: Endometrial Cavity: Normal appearance. No signs of Mullerian duct anomaly or other significant abnormality. Right Fallopian Tube: Mild dilatation with occlusion in the midportion of the right fallopian is seen, consistent with history of recent salpingectomy for ectopic pregnancy. Left Fallopian Tube: No contrast opacification of left fallopian tube seen, consistent with proximal tubal occlusion. Other:  None. IMPRESSION: Bilateral fallopian tube occlusions, as described above. Normal appearance of endometrial cavity. Electronically Signed   By: Earle Gell M.D.   On: 06/18/2015 14:49    Assessment & Plan:   Shannon Davenport was seen today for allergic rhinitis .  Diagnoses and all orders for this visit:  Herpes simplex labialis -     valACYclovir (VALTREX) 500 MG tablet; Take 2 tablets (1,000 mg total) by mouth 2 (two) times daily as needed (for outbreaks).  Allergic rhinitis due to pollen -     Azelastine-Fluticasone (DYMISTA) 137-50 MCG/ACT SUSP; Place 2 Act into the nose 2 (two) times daily. -     levocetirizine (XYZAL) 5 MG tablet; Take 1 tablet (5 mg total) by mouth every evening.   I have discontinued Shannon Davenport's acetaminophen, oxyCODONE-acetaminophen, and ibuprofen. I have also changed her valACYclovir. Additionally, I am having her start on Azelastine-Fluticasone and levocetirizine. Lastly, I am having her maintain her prenatal multivitamin,  metFORMIN, OVIDREL, and letrozole.  Meds ordered this encounter  Medications  . metFORMIN (GLUCOPHAGE) 1000 MG tablet  . OVIDREL 250 MCG/0.5ML injection    Refill:  3  . letrozole (FEMARA) 2.5 MG tablet  . valACYclovir (VALTREX) 500 MG tablet    Sig: Take 2 tablets (1,000 mg total) by mouth 2 (two) times daily as needed (for outbreaks).    Dispense:  60 tablet    Refill:  1  . Azelastine-Fluticasone (DYMISTA) 137-50 MCG/ACT SUSP    Sig: Place 2 Act into the nose 2 (two) times daily.    Dispense:  1 Bottle    Refill:  11  . levocetirizine (XYZAL) 5 MG tablet    Sig: Take 1 tablet (5 mg total) by mouth every evening.    Dispense:  90 tablet    Refill:  1     Follow-up: Return if symptoms worsen or fail to improve.  Scarlette Calico, MD

## 2015-11-19 NOTE — Patient Instructions (Signed)
Allergic Rhinitis Allergic rhinitis is when the mucous membranes in the nose respond to allergens. Allergens are particles in the air that cause your body to have an allergic reaction. This causes you to release allergic antibodies. Through a chain of events, these eventually cause you to release histamine into the blood stream. Although meant to protect the body, it is this release of histamine that causes your discomfort, such as frequent sneezing, congestion, and an itchy, runny nose.  CAUSES Seasonal allergic rhinitis (hay fever) is caused by pollen allergens that may come from grasses, trees, and weeds. Year-round allergic rhinitis (perennial allergic rhinitis) is caused by allergens such as house dust mites, pet dander, and mold spores. SYMPTOMS  Nasal stuffiness (congestion).  Itchy, runny nose with sneezing and tearing of the eyes. DIAGNOSIS Your health care provider can help you determine the allergen or allergens that trigger your symptoms. If you and your health care provider are unable to determine the allergen, skin or blood testing may be used. Your health care provider will diagnose your condition after taking your health history and performing a physical exam. Your health care provider may assess you for other related conditions, such as asthma, pink eye, or an ear infection. TREATMENT Allergic rhinitis does not have a cure, but it can be controlled by:  Medicines that block allergy symptoms. These may include allergy shots, nasal sprays, and oral antihistamines.  Avoiding the allergen. Hay fever may often be treated with antihistamines in pill or nasal spray forms. Antihistamines block the effects of histamine. There are over-the-counter medicines that may help with nasal congestion and swelling around the eyes. Check with your health care provider before taking or giving this medicine. If avoiding the allergen or the medicine prescribed do not work, there are many new medicines  your health care provider can prescribe. Stronger medicine may be used if initial measures are ineffective. Desensitizing injections can be used if medicine and avoidance does not work. Desensitization is when a patient is given ongoing shots until the body becomes less sensitive to the allergen. Make sure you follow up with your health care provider if problems continue. HOME CARE INSTRUCTIONS It is not possible to completely avoid allergens, but you can reduce your symptoms by taking steps to limit your exposure to them. It helps to know exactly what you are allergic to so that you can avoid your specific triggers. SEEK MEDICAL CARE IF:  You have a fever.  You develop a cough that does not stop easily (persistent).  You have shortness of breath.  You start wheezing.  Symptoms interfere with normal daily activities.   This information is not intended to replace advice given to you by your health care provider. Make sure you discuss any questions you have with your health care provider.   Document Released: 12/09/2000 Document Revised: 04/06/2014 Document Reviewed: 11/21/2012 Elsevier Interactive Patient Education 2016 [employer]  

## 2016-08-07 ENCOUNTER — Ambulatory Visit (INDEPENDENT_AMBULATORY_CARE_PROVIDER_SITE_OTHER): Payer: BC Managed Care – PPO | Admitting: Internal Medicine

## 2016-08-07 ENCOUNTER — Other Ambulatory Visit: Payer: Self-pay | Admitting: Internal Medicine

## 2016-08-07 ENCOUNTER — Encounter: Payer: Self-pay | Admitting: Internal Medicine

## 2016-08-07 ENCOUNTER — Other Ambulatory Visit: Payer: BC Managed Care – PPO

## 2016-08-07 VITALS — BP 130/76 | HR 78 | Temp 98.4°F | Resp 12 | Ht 67.0 in | Wt 176.0 lb

## 2016-08-07 DIAGNOSIS — R21 Rash and other nonspecific skin eruption: Secondary | ICD-10-CM

## 2016-08-07 DIAGNOSIS — B001 Herpesviral vesicular dermatitis: Secondary | ICD-10-CM

## 2016-08-07 MED ORDER — PRENATAL MULTIVITAMIN CH: 1.0000 | ORAL_TABLET | Freq: Every day | ORAL | 3 refills | Status: DC

## 2016-08-07 MED ORDER — VALACYCLOVIR HCL 500 MG PO TABS: 1000.0000 mg | ORAL_TABLET | Freq: Two times a day (BID) | ORAL | 1 refills | Status: DC | PRN

## 2016-08-07 NOTE — Progress Notes (Signed)
   Subjective:    Patient ID: Shannon Davenport, female    DOB: [birth year], 36 y.o.   MRN: [id number]  HPI The patient is a 36 YO female coming in for tick bite with rash the next day. She pulled the tick off last night after bringing in her dogs. She felt it in the shower and removed it easily. She denies pain or itching in that area. She did apply baking soda to the area last night to help it although it was not itching or hurting. She is currently undergoing fertility treatments and is concerned about exposure to lyme disease. She denies fevers or chills. No headaches or migraines. No nausea or vomiting. Woke up this morning with slight rash on her back but feels that it is much lower than where the tick was at.   Review of Systems  Constitutional: Negative.   Respiratory: Negative.   Cardiovascular: Negative.   Gastrointestinal: Negative.   Musculoskeletal: Negative.   Skin: Positive for rash.  Neurological: Negative.       Objective:   Physical Exam  Constitutional: She is oriented to person, place, and time. She appears well-developed and well-nourished.  HENT:  Head: Normocephalic and atraumatic.  Eyes: EOM are normal.  Neck: Normal range of motion.  Cardiovascular: Normal rate and regular rhythm.   Pulmonary/Chest: Effort normal.  Abdominal: Soft.  Neurological: She is alert and oriented to person, place, and time.  Skin: Skin is warm and dry. Rash noted.  Small allergic appearing rash on the lower back without central clearing.    Vitals:   08/07/16 0944  BP: 130/76  Pulse: 78  Resp: 12  Temp: 98.4 F (36.9 C)  TempSrc: Oral  SpO2: 98%  Weight: 176 lb (79.8 kg)  Height: 5\' 7"  (1.702 m)      Assessment & Plan:

## 2016-08-07 NOTE — Patient Instructions (Signed)
We will check the blood work today and call you back about the results.   Use the cortisone or benadryl cream.

## 2016-08-07 NOTE — Assessment & Plan Note (Signed)
Checking lyme ab although risk of exposure is extremely low due to concerns about fertility treatment upcoming. Advised to use cortisone or benadryl cream on the area which is likely allergic reaction to the baking soda application.

## 2016-08-10 LAB — LYME AB/WESTERN BLOT REFLEX

## 2016-08-18 ENCOUNTER — Other Ambulatory Visit: Payer: Self-pay | Admitting: Obstetrics and Gynecology

## 2016-09-21 ENCOUNTER — Ambulatory Visit (HOSPITAL_COMMUNITY)
Admission: RE | Admit: 2016-09-21 | Payer: BC Managed Care – PPO | Source: Ambulatory Visit | Admitting: Obstetrics and Gynecology

## 2016-09-21 ENCOUNTER — Encounter (HOSPITAL_COMMUNITY): Admission: RE | Payer: Self-pay | Source: Ambulatory Visit

## 2016-09-21 SURGERY — SALPINGECTOMY, UNILATERAL, LAPAROSCOPIC
Anesthesia: General | Laterality: Right

## 2016-10-13 LAB — OB RESULTS CONSOLE HEPATITIS B SURFACE ANTIGEN: Hepatitis B Surface Ag: NEGATIVE

## 2016-10-13 LAB — OB RESULTS CONSOLE RPR: RPR: NONREACTIVE

## 2016-10-13 LAB — OB RESULTS CONSOLE HIV ANTIBODY (ROUTINE TESTING): HIV: NONREACTIVE

## 2016-10-13 LAB — OB RESULTS CONSOLE RUBELLA ANTIBODY, IGM: Rubella: IMMUNE

## 2016-10-16 LAB — OB RESULTS CONSOLE GC/CHLAMYDIA
Chlamydia: NEGATIVE
GC PROBE AMP, GENITAL: NEGATIVE

## 2016-11-19 ENCOUNTER — Other Ambulatory Visit (HOSPITAL_COMMUNITY): Payer: Self-pay | Admitting: Obstetrics and Gynecology

## 2016-11-19 DIAGNOSIS — Z3689 Encounter for other specified antenatal screening: Secondary | ICD-10-CM

## 2016-11-24 ENCOUNTER — Encounter (HOSPITAL_COMMUNITY): Payer: Self-pay

## 2016-11-26 ENCOUNTER — Encounter (HOSPITAL_COMMUNITY): Payer: Self-pay

## 2016-11-26 ENCOUNTER — Ambulatory Visit (HOSPITAL_COMMUNITY)
Admission: RE | Admit: 2016-11-26 | Discharge: 2016-11-26 | Disposition: A | Discharge status: Home / Self Care | Payer: BC Managed Care – PPO | Source: Ambulatory Visit | Attending: Obstetrics and Gynecology | Admitting: Obstetrics and Gynecology

## 2016-11-26 ENCOUNTER — Other Ambulatory Visit (HOSPITAL_COMMUNITY): Payer: Self-pay | Admitting: Obstetrics and Gynecology

## 2016-11-26 DIAGNOSIS — O281 Abnormal biochemical finding on antenatal screening of mother: Secondary | ICD-10-CM | POA: Insufficient documentation

## 2016-11-26 DIAGNOSIS — Z3A16 16 weeks gestation of pregnancy: Secondary | ICD-10-CM | POA: Diagnosis present

## 2016-11-26 DIAGNOSIS — O09522 Supervision of elderly multigravida, second trimester: Secondary | ICD-10-CM | POA: Diagnosis present

## 2016-11-26 DIAGNOSIS — Z3689 Encounter for other specified antenatal screening: Secondary | ICD-10-CM | POA: Insufficient documentation

## 2016-11-26 DIAGNOSIS — O09529 Supervision of elderly multigravida, unspecified trimester: Secondary | ICD-10-CM

## 2016-11-26 DIAGNOSIS — O289 Unspecified abnormal findings on antenatal screening of mother: Secondary | ICD-10-CM

## 2016-11-27 ENCOUNTER — Other Ambulatory Visit (HOSPITAL_COMMUNITY): Payer: Self-pay | Admitting: *Deleted

## 2016-11-27 DIAGNOSIS — O09529 Supervision of elderly multigravida, unspecified trimester: Secondary | ICD-10-CM | POA: Insufficient documentation

## 2016-11-27 DIAGNOSIS — Z3A16 16 weeks gestation of pregnancy: Secondary | ICD-10-CM | POA: Insufficient documentation

## 2016-11-27 DIAGNOSIS — O289 Unspecified abnormal findings on antenatal screening of mother: Secondary | ICD-10-CM | POA: Insufficient documentation

## 2016-11-27 DIAGNOSIS — O09522 Supervision of elderly multigravida, second trimester: Secondary | ICD-10-CM

## 2016-11-27 NOTE — Progress Notes (Signed)
Genetic Counseling  High-Risk Gestation Note  Appointment Date:  11/27/2016 Referred By: Shannon Salina, MD Date of Birth:  March 10, 1981 Partner:  Shannon Davenport, Shannon Davenport.    Pregnancy History: [id number] Estimated Date of Delivery: 05/08/17 Estimated Gestational Age: [redacted]w[redacted]d Attending: Jolyn Lent, MD  Shannon Davenport was seen for genetic counseling because of a maternal age of 35 y.o. and because of low fetal fraction on noninvasive prenatal screening (Panorama) indicating increased risk for trisomy 2, 21, or triploidy.      In summary:  Discussed AMA and associated risk for fetal aneuploidy  Reviewed results of NIPS (Panorama)- low fetal fraction  Low fetal fraction- associated with 1 in 17 risk for underlying fetal aneuploidy (trisomy 28, trisomy 40, or triploidy) in pregnancies  Specific analysis for fetal aneuploidy conditions unable to be performed due to low fetal fraction  Reviewed other possible explanations for low fetal fraction   Discussed options for screening  Quad screen - patient indicated she would likely declined Quad screen  Ultrasound- performed today; limited by early gestational age but no markers of aneuploidy seen at this time  Discussed diagnostic testing options  Amniocentesis- declined  Reviewed family history concerns  Discussed carrier screening  Patient reported she had carrier screening through infertility work up at [facility] 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   We reviewed chromosomes, nondisjunction, and the associated 1 in 111 risk for fetal aneuploidy related to a maternal age of 36 y.o. at [redacted]w[redacted]d gestation.  She was  counseled that the risk for aneuploidy decreases as gestational age increases, accounting for those pregnancies which spontaneously abort.  We specifically discussed Down syndrome (trisomy 53), trisomies 87 and 65, and sex chromosome aneuploidies (47,XXX and 47,XXY) including the common features and prognoses of each.   Shannon Davenport previously had noninvasive prenatal screening (NIPS)/prenatal cell free DNA testing performed through her OB provider. This screening, specifically Panorama through [facility] laboratory identified low fetal fraction (2.4%), which is reported to have a 1 in 17 risk for underlying fetal aneuploidy (trisomy 14, trisomy 23, or triploidy). We reviewed the methodology of NIPS and discussed differential diagnoses for low fetal fraction including obesity, early gestational age, physiological differences in maternal blood, physiological differences in the placenta, and underlying fetal aneuploidy. Additionally, we discussed that there may be additional factors that impact fetal fraction in pregnancy that are not yet reported in the medical literature. When fetal fraction is on average less than 2.8%, Panorama has in recent years not been able to perform risk assessment for fetal aneuploidy conditions.  Recent outcome study data from pregnancies with low fetal fraction from [facility] laboratory (McKanna et al 2018), when not expected to be low based on maternal weight or gestational age, identified an increased risk for underlying fetal triploidy, trisomy 48, or trisomy 31, with a positive predictive value of approximately 1 in 93.   Detailed ultrasound was performed today. Exam was limited by early gestational age but no gross fetal abnormalities identified and no markers of aneuploidy visualized today. Complete ultrasound report under separate cover. Follow-up ultrasound was scheduled for 12/24/16 to complete fetal anatomic survey.  We reviewed the benefits and limitations of ultrasound as a  screening tool for  fetal aneuploidy. We specifically reviewed its utility in screening for markers of fetal triploidy, trisomy 38, and trisomy 5.    We reviewed additional available screening options including Quad screen and detailed ultrasound.  She was counseled that screening tests are used to modify a patient's a priori risk for aneuploidy, typically based on age. This estimate provides a pregnancy specific risk assessment. We reviewed the benefits and limitations of each option. Specifically, we discussed the conditions for which each test screens, the detection rates, and false positive rates of each. She was also counseled regarding diagnostic testing via amniocentesis. We reviewed the approximate 1 in 062-694 risk for complications from amniocentesis, including spontaneous pregnancy loss. We discussed the possible results that the tests might provide including: positive, negative, unanticipated, and no result. Finally, they were counseled regarding the cost of each option and potential out of pocket expenses. After consideration of all the options, Shannon Davenport declined amniocentesis, stating that they would not elect to pursue this testing in pregnancy given the associated risk for complications. She reported that Quad screen is scheduled to be performed through her OB provider at a later date, but at this time, the patient indicated that she was unlikely to desire this screening and would prefer ultrasound screening only throughout pregnancy. She understands that screening tests cannot rule out all birth defects or genetic syndromes. The patient was advised of this limitation and states she still does not want additional testing at this time.   Shannon Davenport was provided with written information regarding cystic fibrosis (CF), spinal muscular atrophy (SMA) and hemoglobinopathies including the carrier frequency, availability of carrier screening and prenatal diagnosis if indicated. [name] reported that she previously had carrier screening for these conditions during work up for infertility with Dr. Kerin Perna at [facility]. 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 We do not have medical documentation of this at this time. She reported that her husband previously declined SMA carrier screening and is not interested in this option.   SMA is a genetic condition characterized by progressive muscular weakness caused by the degeneration and loss of lower motor nerve cells in the spinal cord and brain stem. SMA has historically been classified into subtypes based on age of onset and severity, with the most common type, SMA I, having onset before age 82 months and leading to severe involvement by age 24 years. SMA is caused by mutations in the SMN1 gene, with associated effects from the SMN2 gene and the resulting phenotype spans a continuum of onset and severity, often without clear genotype to phenotype correlation.  ? We reviewed that SMA is an autosomal recessive condition, meaning that an individual has two copies of the nonworking gene to have the condition. In the general population, the majority of individuals have one copy of SMN1 gene on each chromosome (considered having the genes in trans); however approximately 4% of the population have two copies of the SMN1 gene on a single chromosome (considered having the genes in cis). The number of SMN2 gene copies ranges from zero to five. Approximately 95-98% of individuals with a clinical diagnosis of SMA are homozygous for a deletion of exon 7 on SMN1, and approximately 2-7% of individuals with SMA are compound heterozygotes for a deletion of exon 7 of SMN1 and a mutation of SMN1 detectable by molecular sequence analysis. The presence of three or more copies of SMN2 has been reported to correlate with  milder phenotype. However, given the overlap of subtypes of SMA,  the genotype identified cannot accurately predict the subtype of SMA present and prognosis. ? We discussed genes and autosomal recessive inheritance in detail. Typically, an individual inherits an autosomal recessive condition by inheriting one copy of the nonworking gene change from each parent. However, in approximately 2% of cases of SMA, the affected individual with SMA has one de novo SMN1 mutation, in which case only one parent would be a carrier and full siblings would not be expected to have an increased risk of SMA. Males and females have equal chances to inherit the condition. Carriers of AR conditions are typically not expected to develop symptoms of the disease. We discussed that individuals who are carriers have one copy of the SMN1 gene with a disease causing mutation, and their other SMN1 gene copy functions correctly. We discussed that when both parents are carriers for SMA, each pregnancy has an independent chance for one of the following outcomes: a 25% chance to inherit both mutations and thus have SMA; a 50% chance to inherit one gene mutation and be a carrier similar to parents; and a 25% chance to be neither a carrier nor have SMA. When one parent is a SMA carrier but the other is not, then each pregnancy has a 1 in 2 chance to be a SMA carrier but would not be expected to be at increased risk to inherit SMA.   The carrier frequency for SMA in the African American population is approximately 1 in 60. The frequency has been reported to vary among ethnicities. Thus, prior to Shannon Davenport  having carrier screening, and assuming no known family history of SMA for him or consanguinity to her, the chance for SMA in offspring would be approximately 1 in 140 (0.7%). Given the de novo mutation rate of 2%, the chance could be slightly higher.  Shannon Davenport and her husband stated that they would not be interested in prenatal diagnosis for SMA via amniocentesis, even in the event that Shannon Davenport  was also identified to be a carrier of SMA given the associated risk of complications. We reviewed that [place] is developing newborn screening for SMA under an opt-in research protocol titled, Early Check. This is anticipated to begin in Fall 2018. Additional information regarding MRs. Rager carrier screening results may alter risk assessment for the pregnancy.  ? Shannon Davenport, denied exposure to environmental toxins or chemical agents. She denied the use of alcohol, smoking, and street drugs. She denied significant viral illnesses during the course of her pregnancy. Her medical and surgical histories were contributory for two previous ectopic pregnancies.   I counseled [name] regarding the above risks and available options.  The approximate face-to-face time with the genetic counselor was 55 minutes.  Chipper Oman, MS,  Certified Genetic Counselor 11/27/2016

## 2016-12-08 ENCOUNTER — Other Ambulatory Visit: Payer: Self-pay

## 2016-12-10 ENCOUNTER — Other Ambulatory Visit: Payer: Self-pay

## 2016-12-24 ENCOUNTER — Other Ambulatory Visit (HOSPITAL_COMMUNITY): Payer: Self-pay | Admitting: Maternal and Fetal Medicine

## 2016-12-24 ENCOUNTER — Encounter (HOSPITAL_COMMUNITY): Payer: Self-pay

## 2016-12-24 ENCOUNTER — Ambulatory Visit (HOSPITAL_COMMUNITY)
Admission: RE | Admit: 2016-12-24 | Discharge: 2016-12-24 | Disposition: A | Discharge status: Home / Self Care | Payer: BC Managed Care – PPO | Source: Ambulatory Visit | Attending: Obstetrics and Gynecology | Admitting: Obstetrics and Gynecology

## 2016-12-24 DIAGNOSIS — O09522 Supervision of elderly multigravida, second trimester: Secondary | ICD-10-CM

## 2016-12-24 DIAGNOSIS — D259 Leiomyoma of uterus, unspecified: Secondary | ICD-10-CM | POA: Insufficient documentation

## 2016-12-24 DIAGNOSIS — O289 Unspecified abnormal findings on antenatal screening of mother: Secondary | ICD-10-CM | POA: Diagnosis not present

## 2016-12-24 DIAGNOSIS — O281 Abnormal biochemical finding on antenatal screening of mother: Secondary | ICD-10-CM

## 2016-12-24 DIAGNOSIS — Z3A20 20 weeks gestation of pregnancy: Secondary | ICD-10-CM

## 2016-12-24 DIAGNOSIS — Z362 Encounter for other antenatal screening follow-up: Secondary | ICD-10-CM

## 2016-12-24 DIAGNOSIS — O3412 Maternal care for benign tumor of corpus uteri, second trimester: Secondary | ICD-10-CM | POA: Diagnosis not present

## 2016-12-25 ENCOUNTER — Other Ambulatory Visit (HOSPITAL_COMMUNITY): Payer: Self-pay | Admitting: *Deleted

## 2016-12-25 DIAGNOSIS — IMO0002 Reserved for concepts with insufficient information to code with codable children: Secondary | ICD-10-CM

## 2016-12-25 DIAGNOSIS — Z0489 Encounter for examination and observation for other specified reasons: Secondary | ICD-10-CM

## 2017-01-21 ENCOUNTER — Inpatient Hospital Stay (HOSPITAL_COMMUNITY)
Admission: AD | Admit: 2017-01-21 | Discharge: 2017-01-21 | Disposition: A | Discharge status: Home / Self Care | Payer: BC Managed Care – PPO | Source: Ambulatory Visit | Attending: Obstetrics and Gynecology | Admitting: Obstetrics and Gynecology

## 2017-01-21 ENCOUNTER — Encounter (HOSPITAL_COMMUNITY): Payer: Self-pay

## 2017-01-21 ENCOUNTER — Ambulatory Visit (HOSPITAL_COMMUNITY)
Admission: RE | Admit: 2017-01-21 | Discharge: 2017-01-21 | Disposition: A | Discharge status: Home / Self Care | Payer: BC Managed Care – PPO | Source: Ambulatory Visit | Attending: Obstetrics and Gynecology | Admitting: Obstetrics and Gynecology

## 2017-01-21 ENCOUNTER — Encounter (HOSPITAL_COMMUNITY): Payer: Self-pay | Admitting: *Deleted

## 2017-01-21 DIAGNOSIS — O3412 Maternal care for benign tumor of corpus uteri, second trimester: Secondary | ICD-10-CM | POA: Insufficient documentation

## 2017-01-21 DIAGNOSIS — R51 Headache: Secondary | ICD-10-CM | POA: Insufficient documentation

## 2017-01-21 DIAGNOSIS — Z362 Encounter for other antenatal screening follow-up: Secondary | ICD-10-CM

## 2017-01-21 DIAGNOSIS — O289 Unspecified abnormal findings on antenatal screening of mother: Secondary | ICD-10-CM

## 2017-01-21 DIAGNOSIS — Z7984 Long term (current) use of oral hypoglycemic drugs: Secondary | ICD-10-CM | POA: Insufficient documentation

## 2017-01-21 DIAGNOSIS — O162 Unspecified maternal hypertension, second trimester: Secondary | ICD-10-CM | POA: Diagnosis not present

## 2017-01-21 DIAGNOSIS — O281 Abnormal biochemical finding on antenatal screening of mother: Secondary | ICD-10-CM | POA: Insufficient documentation

## 2017-01-21 DIAGNOSIS — O99612 Diseases of the digestive system complicating pregnancy, second trimester: Secondary | ICD-10-CM | POA: Insufficient documentation

## 2017-01-21 DIAGNOSIS — O26892 Other specified pregnancy related conditions, second trimester: Secondary | ICD-10-CM | POA: Insufficient documentation

## 2017-01-21 DIAGNOSIS — Z79899 Other long term (current) drug therapy: Secondary | ICD-10-CM | POA: Diagnosis not present

## 2017-01-21 DIAGNOSIS — D259 Leiomyoma of uterus, unspecified: Secondary | ICD-10-CM | POA: Insufficient documentation

## 2017-01-21 DIAGNOSIS — Z3A24 24 weeks gestation of pregnancy: Secondary | ICD-10-CM | POA: Insufficient documentation

## 2017-01-21 DIAGNOSIS — R030 Elevated blood-pressure reading, without diagnosis of hypertension: Secondary | ICD-10-CM

## 2017-01-21 DIAGNOSIS — Z0489 Encounter for examination and observation for other specified reasons: Secondary | ICD-10-CM

## 2017-01-21 DIAGNOSIS — O09522 Supervision of elderly multigravida, second trimester: Secondary | ICD-10-CM

## 2017-01-21 DIAGNOSIS — K219 Gastro-esophageal reflux disease without esophagitis: Secondary | ICD-10-CM | POA: Insufficient documentation

## 2017-01-21 DIAGNOSIS — IMO0002 Reserved for concepts with insufficient information to code with codable children: Secondary | ICD-10-CM

## 2017-01-21 DIAGNOSIS — O09529 Supervision of elderly multigravida, unspecified trimester: Secondary | ICD-10-CM

## 2017-01-21 DIAGNOSIS — O132 Gestational [pregnancy-induced] hypertension without significant proteinuria, second trimester: Secondary | ICD-10-CM

## 2017-01-21 LAB — COMPREHENSIVE METABOLIC PANEL
ALK PHOS: 72 U/L (ref 38–126)
ALT: 11 U/L — AB (ref 14–54)
AST: 17 U/L (ref 15–41)
Albumin: 3.2 g/dL — ABNORMAL LOW (ref 3.5–5.0)
Anion gap: 7 (ref 5–15)
BILIRUBIN TOTAL: 0.2 mg/dL — AB (ref 0.3–1.2)
BUN: 9 mg/dL (ref 6–20)
CO2: 23 mmol/L (ref 22–32)
Calcium: 8.7 mg/dL — ABNORMAL LOW (ref 8.9–10.3)
Chloride: 104 mmol/L (ref 101–111)
Creatinine, Ser: 0.5 mg/dL (ref 0.44–1.00)
Glucose, Bld: 85 mg/dL (ref 65–99)
POTASSIUM: 4.1 mmol/L (ref 3.5–5.1)
Sodium: 134 mmol/L — ABNORMAL LOW (ref 135–145)
TOTAL PROTEIN: 6.7 g/dL (ref 6.5–8.1)

## 2017-01-21 LAB — CBC
HCT: 37.1 % (ref 36.0–46.0)
Hemoglobin: 12.3 g/dL (ref 12.0–15.0)
MCH: 29.6 pg (ref 26.0–34.0)
MCHC: 33.2 g/dL (ref 30.0–36.0)
MCV: 89.2 fL (ref 78.0–100.0)
PLATELETS: 210 10*3/uL (ref 150–400)
RBC: 4.16 MIL/uL (ref 3.87–5.11)
RDW: 13.9 % (ref 11.5–15.5)
WBC: 8.4 10*3/uL (ref 4.0–10.5)

## 2017-01-21 LAB — PROTEIN / CREATININE RATIO, URINE
Creatinine, Urine: 11 mg/dL
Total Protein, Urine: <6 mg/dL

## 2017-01-21 LAB — URIC ACID: URIC ACID, SERUM: 3.4 mg/dL (ref 2.3–6.6)

## 2017-01-21 MED ORDER — ACETAMINOPHEN 500 MG PO TABS
1000.0000 mg | ORAL_TABLET | Freq: Once | ORAL | Status: AC
  Administered 2017-01-21: 1000 mg via ORAL
  Filled 2017-01-21: qty 2

## 2017-01-21 NOTE — MAU Note (Signed)
Urine in the lab  

## 2017-01-21 NOTE — Discharge Instructions (Signed)
Preeclampsia warning signs  

## 2017-01-21 NOTE — MAU Note (Signed)
Pt sent to MAU from MFM for ^BP evaluation.  Pt reports currently has H/A.  Denies visual disturbances or epigastric pain.   2+ patellar DTR's  & no clonus noted

## 2017-01-21 NOTE — ED Notes (Signed)
Pt seen today in [place].  Report given to Heywood Bene, RN.  Pt/FOB ambulated and signed into MAU for further monitoring.

## 2017-01-21 NOTE — MAU Note (Signed)
Sent from MFM for increased BP and headache, pain 4/10, dull ache.  Negative for blurred vision, denies vaginal bleeding, positive for fetal movement, 2+ reflex, no clonus.

## 2017-01-21 NOTE — MAU Provider Note (Signed)
History     Chief Complaint  Patient presents with  . Hypertension  . Headache   36 yo G5P0 BF @ 24 5/[redacted] weeks gestation sent from MFM for evaluation of elevated BP. No prior hx during this preg or pre-pregnancy. (+) h/a started at 11 am. No visual changes or epigastric pain. No leg swelling  OB History    Gravida Para Term Preterm AB Living   5       4 0   SAB TAB Ectopic Multiple Live Births   1 2 1           Past Medical History:  Diagnosis Date  . Ectopic pregnancy without intrauterine pregnancy 12/13/2014  . Ectopic pregnancy without intrauterine pregnancy/Laprascopic RT salpingectomy (9/15) 12/13/2014  . GERD (gastroesophageal reflux disease)   . 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 bunioectomy Right 2008  . LAPAROSCOPY N/A 12/13/2014   Procedure: LAPAROSCOPY OPERATIVE;  Surgeon: Brien Few, MD;  Location: [facility];  Service: Gynecology;  Laterality: N/A;  . MYOMECTOMY N/A 03/07/2015   Procedure: Exploratory Laparotomy MYOMECTOMY;  Surgeon: Servando Salina, MD;  Location: [facility];  Service: Gynecology;  Laterality: N/A;  90 min.  Marland Kitchen UNILATERAL SALPINGECTOMY Right 12/13/2014   Procedure: UNILATERAL SALPINGECTOMY;  Surgeon: Brien Few, MD;  Location: [facility];  Service: Gynecology;  Laterality: Right;    Family History  Problem Relation Age of Onset  . Heart disease Mother   . Hyperlipidemia Mother   . Hypertension Mother   . Cancer Neg Hx     Social History  Substance Use Topics  . Smoking status: Never Smoker  . Smokeless tobacco: Never Used     Comment: 07/28/13 "3 puffs / MONTH"  . Alcohol use No    Allergies: No Known Allergies  Prescriptions Prior to Admission  Medication Sig Dispense Refill Last Dose  . Acetaminophen (TYLENOL PO) Take by mouth.   Taking  . Azelastine-Fluticasone (DYMISTA) 137-50 MCG/ACT SUSP Place 2 Act into the nose 2 (two) times daily. (Patient not taking: Reported on 11/26/2016) 1  Bottle 11 Not Taking  . letrozole (FEMARA) 2.5 MG tablet    Not Taking  . levocetirizine (XYZAL) 5 MG tablet Take 1 tablet (5 mg total) by mouth every evening. (Patient not taking: Reported on 11/26/2016) 90 tablet 1 Not Taking  . Loratadine (CLARITIN PO) Take by mouth.   Not Taking  . metFORMIN (GLUCOPHAGE) 1000 MG tablet    Not Taking  . OVIDREL 250 MCG/0.5ML injection   3 Not Taking  . Prenatal Vit-Fe Fumarate-FA (PRENATAL MULTIVITAMIN) TABS tablet Take 1 tablet by mouth daily at 12 noon. 90 tablet 3 Taking  . valACYclovir (VALTREX) 500 MG tablet Take 2 tablets (1,000 mg total) by mouth 2 (two) times daily as needed (for outbreaks). (Patient not taking: Reported on 11/26/2016) 60 tablet 1 Not Taking     Physical Exam   Blood pressure 135/75, pulse 85, temperature 98.9 F (37.2 C), temperature source Oral, height 5\' 7"  (1.702 m), weight 89.8 kg (198 lb), last menstrual period 08/01/2016.  General appearance: alert, cooperative and no distress Lungs: clear to auscultation bilaterally Heart: regular rate and rhythm, S1, S2 normal, no murmur, click, rub or gallop Abdomen: gravid nontender Extremities: no edema, redness or tenderness in the calves or thighs  Tracing: baseline 145 NR  ED Course  IMP: gestational HTN  IUP @ 24 5/7 weeks P) PIH labs. Urine protein/creatinine ratio,. Tylenol 1 gram.  MDM Addendum:  CBC    Component Value Date/Time   WBC 8.4 01/21/2017 1705   RBC 4.16 01/21/2017 1705   HGB 12.3 01/21/2017 1705   HCT 37.1 01/21/2017 1705   PLT 210 01/21/2017 1705   MCV 89.2 01/21/2017 1705   MCH 29.6 01/21/2017 1705   MCHC 33.2 01/21/2017 1705   RDW 13.9 01/21/2017 1705   LYMPHSABS 1.9 02/05/2011 1528   MONOABS 0.4 02/05/2011 1528   EOSABS 0.1 02/05/2011 1528   BASOSABS 0.1 02/05/2011 1528   CMP Latest Ref Rng & Units 01/21/2017 02/05/2011  Glucose 65 - 99 mg/dL 85 80  BUN 6 - 20 mg/dL 9 15  Creatinine 0.44 - 1.00 mg/dL 0.50 0.7  Sodium 135 - 145 mmol/L 134(L)  138  Potassium 3.5 - 5.1 mmol/L 4.1 3.8  Chloride 101 - 111 mmol/L 104 103  CO2 22 - 32 mmol/L 23 27  Calcium 8.9 - 10.3 mg/dL 8.7(L) 9.2  Total Protein 6.5 - 8.1 g/dL 6.7 7.6  Total Bilirubin 0.3 - 1.2 mg/dL 0.2(L) 0.6  Alkaline Phos 38 - 126 U/L 72 52  AST 15 - 41 U/L 17 26  ALT 14 - 54 U/L 11(L) 18  protein/creatinine ratio Unable to calc D/c home  preeclampsia warning signs. F/u 1 wk Kemyra August A, MD 6:19 PM 01/21/2017

## 2017-02-24 ENCOUNTER — Other Ambulatory Visit: Payer: Self-pay

## 2017-02-24 ENCOUNTER — Inpatient Hospital Stay (HOSPITAL_COMMUNITY)
Admission: AD | Admit: 2017-02-24 | Discharge: 2017-02-24 | Disposition: A | Discharge status: Home / Self Care | Payer: BC Managed Care – PPO | Source: Ambulatory Visit | Attending: Obstetrics and Gynecology | Admitting: Obstetrics and Gynecology

## 2017-02-24 DIAGNOSIS — Z3A29 29 weeks gestation of pregnancy: Secondary | ICD-10-CM | POA: Insufficient documentation

## 2017-02-24 DIAGNOSIS — O133 Gestational [pregnancy-induced] hypertension without significant proteinuria, third trimester: Secondary | ICD-10-CM | POA: Diagnosis not present

## 2017-02-24 LAB — CBC
HCT: 37.3 % (ref 36.0–46.0)
HEMOGLOBIN: 12.2 g/dL (ref 12.0–15.0)
MCH: 30 pg (ref 26.0–34.0)
MCHC: 32.7 g/dL (ref 30.0–36.0)
MCV: 91.6 fL (ref 78.0–100.0)
Platelets: 203 10*3/uL (ref 150–400)
RBC: 4.07 MIL/uL (ref 3.87–5.11)
RDW: 14.4 % (ref 11.5–15.5)
WBC: 8.5 10*3/uL (ref 4.0–10.5)

## 2017-02-24 LAB — COMPREHENSIVE METABOLIC PANEL
ALK PHOS: 119 U/L (ref 38–126)
ALT: 16 U/L (ref 14–54)
ANION GAP: 8 (ref 5–15)
AST: 22 U/L (ref 15–41)
Albumin: 3 g/dL — ABNORMAL LOW (ref 3.5–5.0)
BUN: 8 mg/dL (ref 6–20)
CALCIUM: 9.1 mg/dL (ref 8.9–10.3)
CHLORIDE: 104 mmol/L (ref 101–111)
CO2: 23 mmol/L (ref 22–32)
CREATININE: 0.55 mg/dL (ref 0.44–1.00)
Glucose, Bld: 81 mg/dL (ref 65–99)
Potassium: 4.2 mmol/L (ref 3.5–5.1)
SODIUM: 135 mmol/L (ref 135–145)
Total Bilirubin: 0.3 mg/dL (ref 0.3–1.2)
Total Protein: 7 g/dL (ref 6.5–8.1)

## 2017-02-24 LAB — PROTEIN / CREATININE RATIO, URINE: CREATININE, URINE: 21 mg/dL

## 2017-02-24 LAB — POCT PREGNANCY, URINE: PREG TEST UR: POSITIVE — AB

## 2017-02-24 LAB — URIC ACID: URIC ACID, SERUM: 3.8 mg/dL (ref 2.3–6.6)

## 2017-02-24 NOTE — MAU Note (Signed)
Urine in Lab. Culture obtained.

## 2017-02-24 NOTE — MAU Provider Note (Signed)
History     Chief Complaint  Patient presents with  . Hypertension  36 yo G5P0040 BF @ 29 + week gestation sent from the office for lab evaluation due to elev BP in office  pt thinks this related to some family stress as her father has had a stroke. Denies h/a, visual changes or epigastric pain Good FM   OB History    Gravida Para Term Preterm AB Living   5       4 0   SAB TAB Ectopic Multiple Live Births   1 2 1           Past Medical History:  Diagnosis Date  . Ectopic pregnancy without intrauterine pregnancy 12/13/2014  . Ectopic pregnancy without intrauterine pregnancy/Laprascopic RT salpingectomy (9/15) 12/13/2014  . GERD (gastroesophageal reflux disease)   . 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 bunioectomy Right 2008  . LAPAROSCOPY N/A 12/13/2014   Procedure: LAPAROSCOPY OPERATIVE;  Surgeon: Brien Few, MD;  Location: [facility];  Service: Gynecology;  Laterality: N/A;  . MYOMECTOMY N/A 03/07/2015   Procedure: Exploratory Laparotomy MYOMECTOMY;  Surgeon: Servando Salina, MD;  Location: [facility];  Service: Gynecology;  Laterality: N/A;  90 min.  Marland Kitchen UNILATERAL SALPINGECTOMY Right 12/13/2014   Procedure: UNILATERAL SALPINGECTOMY;  Surgeon: Brien Few, MD;  Location: [facility];  Service: Gynecology;  Laterality: Right;    Family History  Problem Relation Age of Onset  . Heart disease Mother   . Hyperlipidemia Mother   . Hypertension Mother   . Cancer Neg Hx     Social History   Tobacco Use  . Smoking status: Never Smoker  . Smokeless tobacco: Never Used  . Tobacco comment: 07/28/13 "3 puffs / MONTH"  Substance Use Topics  . Alcohol use: No    Alcohol/week: 3.0 oz    Types: 5 Glasses of wine per week  . Drug use: No    Allergies: No Known Allergies  Medications Prior to Admission  Medication Sig Dispense Refill Last Dose  . Prenatal Vit-Fe Fumarate-FA (PRENATAL MULTIVITAMIN) TABS tablet Take 1 tablet by  mouth daily at 12 noon. 90 tablet 3 Taking     Physical Exam   Blood pressure (!) 148/83, pulse 82, last menstrual period 08/01/2016, SpO2 96 %.  No exam performed today, done in office. Tracing: baseline 150 (+) accel to 170  ED Course  Gestational HTn in third trimester  IUP @ 29 P) PIH labs done MDM  Addendum:  CBC    Component Value Date/Time   WBC 8.5 02/24/2017 1713   RBC 4.07 02/24/2017 1713   HGB 12.2 02/24/2017 1713   HCT 37.3 02/24/2017 1713   PLT 203 02/24/2017 1713   MCV 91.6 02/24/2017 1713   MCH 30.0 02/24/2017 1713   MCHC 32.7 02/24/2017 1713   RDW 14.4 02/24/2017 1713   LYMPHSABS 1.9 02/05/2011 1528   MONOABS 0.4 02/05/2011 1528   EOSABS 0.1 02/05/2011 1528   BASOSABS 0.1 02/05/2011 1528   CMP Latest Ref Rng & Units 02/24/2017 01/21/2017 02/05/2011  Glucose 65 - 99 mg/dL 81 85 80  BUN 6 - 20 mg/dL 8 9 15   Creatinine 0.44 - 1.00 mg/dL 0.55 0.50 0.7  Sodium 135 - 145 mmol/L 135 134(L) 138  Potassium 3.5 - 5.1 mmol/L 4.2 4.1 3.8  Chloride 101 - 111 mmol/L 104 104 103  CO2 22 - 32 mmol/L 23 23 27   Calcium 8.9 - 10.3 mg/dL 9.1 8.7(L)  9.2  Total Protein 6.5 - 8.1 g/dL 7.0 6.7 7.6  Total Bilirubin 0.3 - 1.2 mg/dL 0.3 0.2(L) 0.6  Alkaline Phos 38 - 126 U/L 119 72 52  AST 15 - 41 U/L 22 17 26   ALT 14 - 54 U/L 16 11(L) 18  IMP: gestational HTN P) defer BP med. D/c home preeclampsia warning signs. Weekly office visit Marvene Staff, MD 5:58 PM 02/24/2017

## 2017-02-24 NOTE — MAU Note (Signed)
Pt. Sent from the doctor's office for evaluation of blood pressures.  Pt. Denies headache, epigastric pain, or blurred vision.  Positive for fetal movement, no vaginal bleeding noted.  Pt. Denies sudden gush of fluid. Urine collected for U/A and PCR. EFM applied - FHR 150s, Toco applied - abd. Soft.

## 2017-03-10 ENCOUNTER — Inpatient Hospital Stay (HOSPITAL_COMMUNITY)
Admission: AD | Admit: 2017-03-10 | Discharge: 2017-03-17 | DRG: 787 | Disposition: A | Discharge status: Home / Self Care | Payer: BC Managed Care – PPO | Source: Ambulatory Visit | Attending: Obstetrics and Gynecology | Admitting: Obstetrics and Gynecology

## 2017-03-10 ENCOUNTER — Other Ambulatory Visit: Payer: Self-pay

## 2017-03-10 DIAGNOSIS — O365930 Maternal care for other known or suspected poor fetal growth, third trimester, not applicable or unspecified: Secondary | ICD-10-CM | POA: Diagnosis present

## 2017-03-10 DIAGNOSIS — O3429 Maternal care due to uterine scar from other previous surgery: Secondary | ICD-10-CM | POA: Diagnosis present

## 2017-03-10 DIAGNOSIS — Z87891 Personal history of nicotine dependence: Secondary | ICD-10-CM

## 2017-03-10 DIAGNOSIS — O1493 Unspecified pre-eclampsia, third trimester: Secondary | ICD-10-CM

## 2017-03-10 DIAGNOSIS — O1410 Severe pre-eclampsia, unspecified trimester: Secondary | ICD-10-CM

## 2017-03-10 DIAGNOSIS — D259 Leiomyoma of uterus, unspecified: Secondary | ICD-10-CM

## 2017-03-10 DIAGNOSIS — D62 Acute posthemorrhagic anemia: Secondary | ICD-10-CM | POA: Diagnosis not present

## 2017-03-10 DIAGNOSIS — Z3A31 31 weeks gestation of pregnancy: Secondary | ICD-10-CM | POA: Diagnosis not present

## 2017-03-10 DIAGNOSIS — O1414 Severe pre-eclampsia complicating childbirth: Principal | ICD-10-CM | POA: Diagnosis present

## 2017-03-10 DIAGNOSIS — R030 Elevated blood-pressure reading, without diagnosis of hypertension: Secondary | ICD-10-CM | POA: Diagnosis present

## 2017-03-10 DIAGNOSIS — O3413 Maternal care for benign tumor of corpus uteri, third trimester: Secondary | ICD-10-CM

## 2017-03-10 DIAGNOSIS — O9081 Anemia of the puerperium: Secondary | ICD-10-CM | POA: Diagnosis not present

## 2017-03-10 DIAGNOSIS — O1413 Severe pre-eclampsia, third trimester: Secondary | ICD-10-CM

## 2017-03-10 DIAGNOSIS — O09523 Supervision of elderly multigravida, third trimester: Secondary | ICD-10-CM

## 2017-03-10 DIAGNOSIS — O281 Abnormal biochemical finding on antenatal screening of mother: Secondary | ICD-10-CM

## 2017-03-10 LAB — CBC
HEMATOCRIT: 40.5 % (ref 36.0–46.0)
HEMOGLOBIN: 13.4 g/dL (ref 12.0–15.0)
MCH: 30.1 pg (ref 26.0–34.0)
MCHC: 33.1 g/dL (ref 30.0–36.0)
MCV: 91 fL (ref 78.0–100.0)
Platelets: 216 10*3/uL (ref 150–400)
RBC: 4.45 MIL/uL (ref 3.87–5.11)
RDW: 14.1 % (ref 11.5–15.5)
WBC: 8 10*3/uL (ref 4.0–10.5)

## 2017-03-10 LAB — COMPREHENSIVE METABOLIC PANEL
ALBUMIN: 2.8 g/dL — AB (ref 3.5–5.0)
ALK PHOS: 189 U/L — AB (ref 38–126)
ALT: 13 U/L — ABNORMAL LOW (ref 14–54)
ANION GAP: 11 (ref 5–15)
AST: 26 U/L (ref 15–41)
BUN: 10 mg/dL (ref 6–20)
CHLORIDE: 102 mmol/L (ref 101–111)
CO2: 19 mmol/L — AB (ref 22–32)
Calcium: 9 mg/dL (ref 8.9–10.3)
Creatinine, Ser: 0.75 mg/dL (ref 0.44–1.00)
GFR calc non Af Amer: >60 mL/min (ref >60)
GLUCOSE: 119 mg/dL — AB (ref 65–99)
Potassium: 4.2 mmol/L (ref 3.5–5.1)
SODIUM: 132 mmol/L — AB (ref 135–145)
Total Bilirubin: 0.6 mg/dL (ref 0.3–1.2)
Total Protein: 6.5 g/dL (ref 6.5–8.1)

## 2017-03-10 LAB — PROTEIN / CREATININE RATIO, URINE
Creatinine, Urine: 37 mg/dL
Protein Creatinine Ratio: 0.62 mg/mg{Cre} — ABNORMAL HIGH (ref 0.00–0.15)
Total Protein, Urine: 23 mg/dL

## 2017-03-10 LAB — TYPE AND SCREEN
ABO/RH(D): O POS
ANTIBODY SCREEN: NEGATIVE

## 2017-03-10 LAB — URIC ACID: URIC ACID, SERUM: 5.2 mg/dL (ref 2.3–6.6)

## 2017-03-10 MED ORDER — LABETALOL HCL 5 MG/ML IV SOLN: 20.0000 mg | INTRAVENOUS | Status: DC | PRN

## 2017-03-10 MED ORDER — HYDRALAZINE HCL 20 MG/ML IJ SOLN: 10.0000 mg | Freq: Once | INTRAMUSCULAR | Status: DC | PRN

## 2017-03-10 MED ORDER — MAGNESIUM SULFATE 40 G IN LACTATED RINGERS - SIMPLE
2.0000 g/h | INTRAVENOUS | Status: DC
  Administered 2017-03-10 – 2017-03-11 (×2): 2 g/h via INTRAVENOUS
  Filled 2017-03-10: qty 500
  Filled 2017-03-10: qty 40

## 2017-03-10 MED ORDER — BETAMETHASONE SOD PHOS & ACET 6 (3-3) MG/ML IJ SUSP
12.0000 mg | INTRAMUSCULAR | Status: AC
  Administered 2017-03-10 – 2017-03-11 (×2): 12 mg via INTRAMUSCULAR
  Filled 2017-03-10 (×2): qty 2

## 2017-03-10 MED ORDER — MAGNESIUM SULFATE BOLUS VIA INFUSION
6.0000 g | Freq: Once | INTRAVENOUS | Status: AC
  Administered 2017-03-10: 6 g via INTRAVENOUS
  Filled 2017-03-10: qty 500

## 2017-03-10 MED ORDER — LACTATED RINGERS IV SOLN
INTRAVENOUS | Status: DC
  Administered 2017-03-10 – 2017-03-13 (×4): via INTRAVENOUS

## 2017-03-10 NOTE — Consult Note (Signed)
Neonatology Consult to Antenatal Patient:  I was asked by Dr. Murrell Redden to see this patient in order to provide antenatal counseling due to severe pre-eclampsia, IUGR, and elevated risk for trisomy per NIPT.  Ms. Shannon Davenport was admitted tonight at 5 4/[redacted] weeks GA due to worsening gestational HTN, now pre-eclampsia. S/P myomectomy with plans for scheduled C-section delivery at 37 weeks. She is being followed by MFM and had "genetic counseling" (per patient); patient's understanding is that her risk for trisomy is lower than originally thought. She is currently not having active labor; BPP  Is 8/8. She is getting BMZ and is on Magnesium sulfate. This is her first baby, a female.  I spoke with the patient alone. We discussed the worst case of delivery in the next few days, including usual DR management, possible respiratory complications and need for support, IV access, feedings (mother desires breast feeding, which was encouraged), LOS, Mortality and Morbidity, and long term outcomes. We also discussed criteria for NICU admission if she should remain pregnant past 35 weeks. I spoke with her about the increased risk for genetic abnormality, and that both pre-eclampsia and trisomies are frequently associated with lagging growth of the baby, as we are seeing here. After examination of the baby at delivery, we should have a good idea if a genetic syndrome is present and will be able to decide whether genetic testing of the infant needs to be done. I offered a NICU tour to any interested family members and would be glad to come back if she has more questions later.  Thank you for asking me to see this patient.  Real Cons, MD Neonatologist  The total length of face-to-face or floor/unit time for this encounter was 30 minutes. Counseling and/or coordination of care was 20 minutes of the above.

## 2017-03-10 NOTE — H&P (Addendum)
Shannon Davenport is a 35 y.o. female G5P0040 at 31.4wga by 7 wk u/s presenting for elevated bp noted in office and sent from office to hospital for admission.  Bps 153/105, 180/100.  The patient denies any h/a, vision changes, RUQ pain.  The patient did go to eat chinese rice/chicken/broccoli prior to arrival from office and says she feels better.  She denies any lof/vb/ctx, does note +FM.  The patient has an antepartum course complicated by [facility] with worsening bps over last 2weeks.  She has not required medication for her pressures.  She has been followed with antenatal testing and serial growth u/s.  Her course is also complicated by AMA and had an NIPT result with low fetal fraction of 2.4% and increased risk of triploidy, trisomy 58, or trisomy 19.  AFP was negative.  She had an initial consult with MFM and declined further testing.  She has had continued f/u with MFM since initial consult.  BPP 8/8 today.  Growth u/s was done today: efw 33% (3'5"), however lagging growth noted: AC 3%, HC 0%, BPD 1%.  AFI 12.5cm, UA dopplers wnl. Patient has a h/o myomectomy and plan for c/s for delivery.  [facility] at [facility] since 10 wks History OB History    Gravida Para Term Preterm AB Living   5       4 0   SAB TAB Ectopic Multiple Live Births   1 2 1         eab x2 sabx1 Ectopic x1 (salpingectomy)  Past Medical History:  Diagnosis Date  . Ectopic pregnancy without intrauterine pregnancy 12/13/2014  . Ectopic pregnancy without intrauterine pregnancy/Laprascopic RT salpingectomy (9/15) 12/13/2014  . GERD (gastroesophageal reflux disease)   . 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   Past Surgical History:  Procedure Laterality Date  . bunioectomy Right 2008  . LAPAROSCOPY N/A 12/13/2014   Procedure: LAPAROSCOPY OPERATIVE;  Surgeon: Brien Few, MD;  Location: [place] [facility];  Service: Gynecology;  Laterality: N/A;  . MYOMECTOMY N/A 03/07/2015   Procedure: Exploratory Laparotomy MYOMECTOMY;  Surgeon:  Servando Salina, MD;  Location: [place] [facility];  Service: Gynecology;  Laterality: N/A;  90 min.  Marland Kitchen UNILATERAL SALPINGECTOMY Right 12/13/2014   Procedure: UNILATERAL SALPINGECTOMY;  Surgeon: Brien Few, MD;  Location: [facility];  Service: Gynecology;  Laterality: Right;   Family History: family history includes Heart disease in her mother; Hyperlipidemia in her mother; Hypertension in her mother. Social History:  reports that  has never smoked. she has never used smokeless tobacco. She reports that she does not drink alcohol or use drugs.  ROS    Blood pressure (!) 147/93, pulse 83, temperature 98.5 F (36.9 C), temperature source Oral, resp. rate 18, height 5\' 7"  (1.702 m), weight 205 lb (93 kg), last menstrual period 08/01/2016, SpO2 98 %. Exam Physical Exam  Prenatal labs: ABO, Rh:  O positive Antibody:  neg Rubella:  immune RPR:   neg HBsAg:   neg HIV:   neg GBS:   unknown 1 hr Glucola - 87 Genetic screening - abnml nipt, neg afp; pt declined further testing Anatomy US - wnl  Patient Vitals for the past 24 hrs:  BP Temp Temp src Pulse Resp SpO2 Height Weight  03/10/17 1930 (!) 147/93 - - 83 - 98 % - -  03/10/17 1916 140/84 - - 80 - - - -  03/10/17 1900 (!) 140/91 - - 77 - - - -  03/10/17 1854 (!) 150/86 - - 85 - 100 % - -  03/10/17 1848 - - - - - 99 % - -  03/10/17 1845 (!) 161/89 - - 79 - 100 % - -  03/10/17 1831 (!) 146/81 - - 81 - - - -  03/10/17 1826 (!) 153/82 - - 87 - - - -  03/10/17 1802 (!) 161/90 98.5 F (36.9 C) Oral 81 18 100 % 5\' 7"  (1.702 m) 205 lb (93 kg)     Physical Exam:  A&O x 3  HEENT : grossly wnl Lungs : ctab CV : rrr Abdo : soft, nt, gravid; efw 3lbs Extr : no edema, nt bilat LE; 2+ dtr   Results for orders placed or performed during the hospital encounter of 03/10/17 (from the past 24 hour(s))  Protein / creatinine ratio, urine     Status: Abnormal   Collection Time: 03/10/17  6:08 PM  Result Value Ref Range   Creatinine, Urine 37.00 mg/dL    Total Protein, Urine 23 mg/dL   Protein Creatinine Ratio 0.62 (H) 0.00 - 0.15 mg/mg[Cre]  Comprehensive metabolic panel     Status: Abnormal   Collection Time: 03/10/17  6:30 PM  Result Value Ref Range   Sodium 132 (L) 135 - 145 mmol/L   Potassium 4.2 3.5 - 5.1 mmol/L   Chloride 102 101 - 111 mmol/L   CO2 19 (L) 22 - 32 mmol/L   Glucose, Bld 119 (H) 65 - 99 mg/dL   BUN 10 6 - 20 mg/dL   Creatinine, Ser 0.75 0.44 - 1.00 mg/dL   Calcium 9.0 8.9 - 10.3 mg/dL   Total Protein 6.5 6.5 - 8.1 g/dL   Albumin 2.8 (L) 3.5 - 5.0 g/dL   AST 26 15 - 41 U/L   ALT 13 (L) 14 - 54 U/L   Alkaline Phosphatase 189 (H) 38 - 126 U/L   Total Bilirubin 0.6 0.3 - 1.2 mg/dL   GFR calc non Af Amer >60 >60 mL/min   GFR calc Af Amer >60 >60 mL/min   Anion gap 11 5 - 15  CBC     Status: None   Collection Time: 03/10/17  6:30 PM  Result Value Ref Range   WBC 8.0 4.0 - 10.5 K/uL   RBC 4.45 3.87 - 5.11 MIL/uL   Hemoglobin 13.4 12.0 - 15.0 g/dL   HCT 40.5 36.0 - 46.0 %   MCV 91.0 78.0 - 100.0 fL   MCH 30.1 26.0 - 34.0 pg   MCHC 33.1 30.0 - 36.0 g/dL   RDW 14.1 11.5 - 15.5 %   Platelets 216 150 - 400 K/uL   FHT: 140s, normal variability, +accels, no decels Toco: no ctx  Assessment/Plan: iup at 31.4wga 1. Severe pre-eclampsia: severe range blood pressures and elevated pr/cr ratio - begin magnesium sulfate; currently bps are mild range and will follow closely, she has not required any medications to treat severe range bp; contin bedrest and strict I/o; normal lfts, plts, h/h wnl, bun/cr wnl - plan for expectant management but will obs closely for any worsening fetal status or maternal status which would then change plan to delivery 2. IUGR: lagging ac/hc/bpd measurements today - plan for repeat growth/dopplers in am with MFM 3. Fetal status reassuring, also bpp 8/8 today - continue continuous monitoring 4. Prematurity - bmz today at 1830, repeat dose in 24 hrs; nicu consult - Dr. Tora Kindred aware and will see  patient tonight 5. abnml nipt, neg afp - declined further testing, followed by mfm with growth u/s 6. Rh pos 7.  Rubella immune 8. H/o prior myomectomy, for c/s  9. Consult MFM 10. gbs unknown  I have reviewed the above with Dr. Lucia Gaskins - recommendations for expectant management at this time but moving toward delivery; contin mag sulfate; treat bps with bp protocol prn, if bps uncontrolled after going through protocol, would recommend delivery; plan repeat growth u/s and dopplers in am, no bpp since 8/8 today and reassuring fetal status currently; bmz x2; formal consult pending for am after growth/dopplers.     Charyl Bigger 03/10/2017, 8:01 PM  I reviewed the patient's diagnosis and plan with her along with consult with MFM and NICU.  S/p nicu consult.  Pt understands repeat growth u/s and dopplers in am to follow baby's growth.  She understands that she is inpatient until delivery and at this time expectant management but plan could change to delivery if worsening baby/maternal status.  Reviewed mag sulfate, bmz, and following bps closely.  Questions answered.

## 2017-03-11 ENCOUNTER — Encounter (HOSPITAL_COMMUNITY): Payer: Self-pay

## 2017-03-11 ENCOUNTER — Inpatient Hospital Stay (HOSPITAL_COMMUNITY): Payer: BC Managed Care – PPO

## 2017-03-11 MED ORDER — ACETAMINOPHEN 325 MG PO TABS
650.0000 mg | ORAL_TABLET | Freq: Four times a day (QID) | ORAL | Status: DC | PRN
  Administered 2017-03-11 – 2017-03-13 (×5): 650 mg via ORAL
  Filled 2017-03-11 (×5): qty 2

## 2017-03-11 MED ORDER — FAMOTIDINE 20 MG PO TABS
20.0000 mg | ORAL_TABLET | Freq: Every day | ORAL | Status: DC
  Administered 2017-03-11 – 2017-03-13 (×3): 20 mg via ORAL
  Filled 2017-03-11 (×3): qty 1

## 2017-03-11 MED ORDER — MAGNESIUM SULFATE 40 G IN LACTATED RINGERS - SIMPLE
2.0000 g/h | INTRAVENOUS | Status: DC
  Administered 2017-03-12: 2 g/h via INTRAVENOUS
  Filled 2017-03-11: qty 40

## 2017-03-11 NOTE — Progress Notes (Signed)
Patient ID: Shannon Davenport, female   DOB: 15-Jul-1980, 36 y.o.   MRN: [id number] 36 yo female G5P0, 31.5 wks with severe preeclampsia, prior myomectomy hx- planning C/section delivery, Fetal growth restriction, Increased risk of T13/18, low fetal fractions on NIPS.   Pt feels well, no HA/ SOB/ CP/ vision changes. Feels slight fatigue. Good FMs.   BP (!) 141/83   Pulse 95   Temp 97.7 F (36.5 C) (Oral)   Resp 20   Ht 5\' 7"  (1.702 m)   Wt 205 lb (93 kg)   LMP 08/01/2016   SpO2 96%   BMI 32.11 kg/m   FHT 130s/ min variability/ + accels 10x10/ no decels - overall category I Toco- none   BPs in severe range at admission but no antiHTM meds needed since improved after admission. PTL/LFTs normal range.  BTMZ 6.30 pm 12/12, 12/13 On MagSo4, Strict I/O, no s/s of toxicity.  MFM consult reviewed, recc. Stop MagSo4 when steroid course complete, will stop at 7 pm on 12/14. Daily labs. CEFM. Wkly BPP/ Dopplers, repeat growth in 2-3 wks. Delivery at 34 wks.  NICU aware.   Keep C/section consent ready in chart.   V.Bria Sparr, MD

## 2017-03-11 NOTE — Progress Notes (Addendum)
Pt says she feels fine, doesn't feel badly with mag sulfate; rested overnight Small h/a but feels this from NPO; no visual disturbances or RUQ pain  Bps: 140s-150s/80-s90s Other vs wnl I/o: 157ml in last 2 hr, previously 384ml in 3 hr  A&ox3 rrr ctab Abd: soft, nt, gravid LE: no edema, nt bilat; 2+ dtr  FHT: 120s-130s baseline, normal variability periods and also periods of minimal variability; +accels noted; one variabile Toco: rare, very mild appearing ctx  A/p: iup at 31.5wga  1. Severe pre-e: contin mag sulfate; no s/s toxicity; may adv diet pending u/s this am; bps stable; contin strict I/Os; contin expectant manamgent; MFM consult pending 2. Prematurity: bmz second dose 1830; s/p nicu consult; fetal status overall reassuring 3. abnml nipt, neg afp, no further testing 4. IUGR with nml dopplers; repeat growth u/s and dopplers pending this am and plan per MFM for f/u 5. H/o myomectomy, for c/s

## 2017-03-11 NOTE — Progress Notes (Signed)
No c/o sob/cp; resting No h/a, vision changes, ruq pain; no c/o  Temp:  [97.9 F (36.6 C)-98.5 F (36.9 C)] 98.1 F (36.7 C) (12/13 0300) Pulse Rate:  [75-108] 88 (12/13 0300) Resp:  [16-18] 16 (12/13 0300) BP: (140-161)/(80-103) 153/95 (12/13 0300) SpO2:  [97 %-100 %] 97 % (12/13 0300) Weight:  [205 lb (93 kg)] 205 lb (93 kg) (12/12 1802)  Bps 140s-153/80s-90s   Intake/Output Summary (Last 24 hours) at 03/11/2017 0315 Last data filed at 03/11/2017 0300 Gross per 24 hour  Intake 875 ml  Output 2350 ml  Net -1475 ml   A&ox3 rrr ctab Abd: soft, nt, nd LE: no edema, nt bilat LE; scds on bilat Dtr: 1-2 +  Fht: 120s, 130s baseline; mostly normal variability, some periods decreased variaibility; +accels, 1 decel noted with position change, 1 min to 90s and back to baseline; other decel 38min prior at 0100 and about 1 min to 90s and return to baseline; +accels noted Toco: no contractions  A/p: iup at 31.5 1. Severe pre-e: bps mild range, contin to follow closely; contin I/o, bedrest - stable 2. Prematurity - s/p bmz x1, plan 2nd dose tonight 3. Fetal status overall reassuring, contin efm 4. Lagging growth head/ac measurements - growth u/s and dopplers pending, MFM consult pending 5. H/o myomectomy, for c/s

## 2017-03-11 NOTE — Consult Note (Signed)
Maternal Fetal Medicine Consultation  Requesting Provider(s):Almquist  Primary OB: Cousins Reason for consultation: severe preeclampsia  HPI: 36yo P0040 at 31+5 weeks admitted with increased urinary protein and severe range BP. She had elevated BP with normal urinary P/C for several weeks prior to this admission. She presented with severe range BP 180/100. She is asymptomatic. BPP 8/8 in office yesterday. She has started MgSO4 and beta methasone. Labs show normal LFTs and platelets. Urine P/C is elevated now OB History: OB History    Gravida Para Term Preterm AB Living   5       4 0   SAB TAB Ectopic Multiple Live Births   1 2 1           PMH:  Past Medical History:  Diagnosis Date  . Ectopic pregnancy without intrauterine pregnancy 12/13/2014  . Ectopic pregnancy without intrauterine pregnancy/Laprascopic RT salpingectomy (9/15) 12/13/2014  . GERD (gastroesophageal reflux disease)   . 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 bunioectomy Right 2008  . LAPAROSCOPY N/A 12/13/2014   Procedure: LAPAROSCOPY OPERATIVE;  Surgeon: Brien Few, MD;  Location: [place] [facility];  Service: Gynecology;  Laterality: N/A;  . MYOMECTOMY N/A 03/07/2015   Procedure: Exploratory Laparotomy MYOMECTOMY;  Surgeon: Servando Salina, MD;  Location: [facility];  Service: Gynecology;  Laterality: N/A;  90 min.  Marland Kitchen UNILATERAL SALPINGECTOMY Right 12/13/2014   Procedure: UNILATERAL SALPINGECTOMY;  Surgeon: Brien Few, MD;  Location: [facility];  Service: Gynecology;  Laterality: Right;   Meds: See EPIC section Allergies: NKDA FH: See EPIC section Soc: See EPIC section  Review of Systems: no vaginal bleeding or cramping/contractions, no LOF, no nausea/vomiting. No RUQ or epigastric pain. No headache or visual changes. All other systems reviewed and are negative.  PE:  VS: See EPIC section GEN: well-appearing female ABD: gravid, NT NEURO: patellar DTR 3+, 2-3  beats clonus at ankle  Please see separate document for fetal ultrasound report.  A/P: Severe preeclampsia I agree with the current plan to conservatively manage the patient. She may go ahead with regular diet MgSO4$ should be continued through her betamethasone course then discontinued. Should she experience repetitive bouts of severe range BP after completion of the betamethasone course, delivery would be recommended. Daily platelets and LFTs should be assessed. She should have BPP and dopplers in 1 week if undelivered Delivery at 34 weeks if she remains stable      Thank you for the opportunity to be a part of the care of Shannon Davenport. Please contact our office if we can be of further assistance.   I spent approximately 30 minutes with this patient with over 50% of time spent in face-to-face counseling.

## 2017-03-12 ENCOUNTER — Other Ambulatory Visit: Payer: Self-pay | Admitting: Obstetrics and Gynecology

## 2017-03-12 LAB — COMPREHENSIVE METABOLIC PANEL
ALBUMIN: 2.8 g/dL — AB (ref 3.5–5.0)
ALT: 15 U/L (ref 14–54)
ANION GAP: 8 (ref 5–15)
AST: 26 U/L (ref 15–41)
Alkaline Phosphatase: 174 U/L — ABNORMAL HIGH (ref 38–126)
BUN: 11 mg/dL (ref 6–20)
CHLORIDE: 106 mmol/L (ref 101–111)
CO2: 19 mmol/L — AB (ref 22–32)
Calcium: 7.2 mg/dL — ABNORMAL LOW (ref 8.9–10.3)
Creatinine, Ser: 0.77 mg/dL (ref 0.44–1.00)
GFR calc non Af Amer: >60 mL/min (ref >60)
GLUCOSE: 128 mg/dL — AB (ref 65–99)
POTASSIUM: 4.1 mmol/L (ref 3.5–5.1)
SODIUM: 133 mmol/L — AB (ref 135–145)
Total Bilirubin: 0.2 mg/dL — ABNORMAL LOW (ref 0.3–1.2)
Total Protein: 6.8 g/dL (ref 6.5–8.1)

## 2017-03-12 LAB — CBC
HCT: 38.6 % (ref 36.0–46.0)
HEMOGLOBIN: 12.8 g/dL (ref 12.0–15.0)
MCH: 30.1 pg (ref 26.0–34.0)
MCHC: 33.2 g/dL (ref 30.0–36.0)
MCV: 90.8 fL (ref 78.0–100.0)
PLATELETS: 238 10*3/uL (ref 150–400)
RBC: 4.25 MIL/uL (ref 3.87–5.11)
RDW: 14.6 % (ref 11.5–15.5)
WBC: 12 10*3/uL — ABNORMAL HIGH (ref 4.0–10.5)

## 2017-03-12 MED ORDER — LABETALOL HCL 100 MG PO TABS
100.0000 mg | ORAL_TABLET | Freq: Three times a day (TID) | ORAL | Status: DC
Start: 2017-03-12 — End: 2017-03-13
  Administered 2017-03-12 – 2017-03-13 (×3): 100 mg via ORAL
  Filled 2017-03-12 (×3): qty 1

## 2017-03-12 NOTE — Progress Notes (Addendum)
Patient ID: Shannon Davenport, female   DOB: 05-12-1980, 36 y.o.   MRN: [id number] HD # 3 31.6 wks, AMA, G5P0 Severe preeclampsia Hx of Myomectomy - for C/section Fetal growth restriction Increased risk of T13/18, low fetal fractions on NIPS.   S:Pt feels well, no HA/ SOB/ CP/ vision changes. Good FMs.   O: BP (!) 148/87   Pulse 99   Temp 97.7 F (36.5 C) (Oral)   Resp 18   Ht 5\' 7"  (1.702 m)   Wt 205 lb (93 kg)   LMP 08/01/2016   SpO2 96%   BMI 32.11 kg/m    Pulse range : 83-114 BP range: 114-156/ 69-96  Intake/Output Summary (Last 24 hours) at 03/12/2017 0902 Last data filed at 03/12/2017 0600 Gross per 24 hour  Intake 2000 ml  Output 2030 ml  Net -30 ml   A&O x 3, no acute distress. Pleasant HEENT neg Lungs CTA bilat CV RRR, S1S2 normal Abdo soft, non tender, non acute Extr no edema/ tenderness. DTR +2/+2, no clonus  FHT 130s/ min variability/ + accels 10x10  intermittent late decels but <1 per hours from 7 pm to 10 pm and one at 1.35 am. Resolved since. overall category I Toco- none   MFM sono 12/13 Vtx, EFW 1456 gm/ 3 lb 3 oz at 21%, ac 5% ( 2 wk lag), AFI 12.4 cm, placenta posterior, S/D 3.18 (72%)   A/P: 36 yo, G5P0, 31.6 wks. AMA 1) Severe PEC - stable BPs, no antiHTN med used, labs daily per MFM, stable this AM, no HELLP, I/O good                          - on Mag-sulfate, will stop at 8 pm tonight, 24 hrs from 2nd steroid                          - Delivery by C/s at 34 wks per MFM and sooner for maternal/ fetal compromise                           - Remains in-pt. 2) Prematurity - BTMZ 8 pm 12/12, 12/13, NICU consult, s/p Magnesium for neuroprophylaxis, no need to repeat-32 wks tomorrow.  3) Fetal Growth Restriction - see MFM sono 12/13 and MFM recc. CEFM. Wkly Dopplers and BPP.                           -Delivery planned at 34 wks, no repeat growth scheduled 4) Prior Myomectomy - C/section reviewed and consent obtained yesterday and in chart.  5)  AMA, abn NIPS for increased T13/18 risk. NICU aware   V.Benjie Karvonen, MD

## 2017-03-13 ENCOUNTER — Encounter (HOSPITAL_COMMUNITY): Payer: Self-pay | Admitting: Anesthesiology

## 2017-03-13 ENCOUNTER — Inpatient Hospital Stay (HOSPITAL_COMMUNITY): Payer: BC Managed Care – PPO | Admitting: Anesthesiology

## 2017-03-13 ENCOUNTER — Encounter (HOSPITAL_COMMUNITY)
Admission: AD | Disposition: A | Discharge status: Home / Self Care | Payer: Self-pay | Source: Ambulatory Visit | Attending: Obstetrics and Gynecology

## 2017-03-13 LAB — COMPREHENSIVE METABOLIC PANEL
ALT: 17 U/L (ref 14–54)
AST: 30 U/L (ref 15–41)
Albumin: 2.6 g/dL — ABNORMAL LOW (ref 3.5–5.0)
Alkaline Phosphatase: 152 U/L — ABNORMAL HIGH (ref 38–126)
Anion gap: 9 (ref 5–15)
BILIRUBIN TOTAL: 0.1 mg/dL — AB (ref 0.3–1.2)
BUN: 10 mg/dL (ref 6–20)
CALCIUM: 7.1 mg/dL — AB (ref 8.9–10.3)
CO2: 20 mmol/L — ABNORMAL LOW (ref 22–32)
CREATININE: 0.67 mg/dL (ref 0.44–1.00)
Chloride: 104 mmol/L (ref 101–111)
GFR calc Af Amer: >60 mL/min (ref >60)
Glucose, Bld: 101 mg/dL — ABNORMAL HIGH (ref 65–99)
POTASSIUM: 4 mmol/L (ref 3.5–5.1)
Sodium: 133 mmol/L — ABNORMAL LOW (ref 135–145)
TOTAL PROTEIN: 6 g/dL — AB (ref 6.5–8.1)

## 2017-03-13 LAB — CBC
HEMATOCRIT: 35.4 % — AB (ref 36.0–46.0)
Hemoglobin: 11.7 g/dL — ABNORMAL LOW (ref 12.0–15.0)
MCH: 30.3 pg (ref 26.0–34.0)
MCHC: 33.1 g/dL (ref 30.0–36.0)
MCV: 91.7 fL (ref 78.0–100.0)
Platelets: 210 10*3/uL (ref 150–400)
RBC: 3.86 MIL/uL — ABNORMAL LOW (ref 3.87–5.11)
RDW: 14.9 % (ref 11.5–15.5)
WBC: 12.3 10*3/uL — AB (ref 4.0–10.5)

## 2017-03-13 SURGERY — Surgical Case
Anesthesia: Spinal | Wound class: Clean Contaminated

## 2017-03-13 MED ORDER — CEFAZOLIN SODIUM-DEXTROSE 2-3 GM-%(50ML) IV SOLR
INTRAVENOUS | Status: AC
  Filled 2017-03-13: qty 50

## 2017-03-13 MED ORDER — CEFAZOLIN SODIUM-DEXTROSE 2-3 GM-%(50ML) IV SOLR
INTRAVENOUS | Status: DC | PRN
  Administered 2017-03-13: 2 g via INTRAVENOUS

## 2017-03-13 MED ORDER — SENNOSIDES-DOCUSATE SODIUM 8.6-50 MG PO TABS
2.0000 | ORAL_TABLET | ORAL | Status: DC
  Administered 2017-03-13 – 2017-03-17 (×4): 2 via ORAL
  Filled 2017-03-13 (×4): qty 2

## 2017-03-13 MED ORDER — OXYCODONE HCL 5 MG PO TABS: 5.0000 mg | ORAL_TABLET | Freq: Once | ORAL | Status: DC | PRN

## 2017-03-13 MED ORDER — HYDRALAZINE HCL 20 MG/ML IJ SOLN
10.0000 mg | Freq: Once | INTRAMUSCULAR | Status: DC | PRN
  Filled 2017-03-13 (×2): qty 1

## 2017-03-13 MED ORDER — OXYCODONE-ACETAMINOPHEN 5-325 MG PO TABS
1.0000 | ORAL_TABLET | ORAL | Status: DC | PRN
  Administered 2017-03-14 (×2): 1 via ORAL
  Filled 2017-03-13 (×2): qty 1

## 2017-03-13 MED ORDER — DIBUCAINE 1 % RE OINT: 1.0000 "application " | TOPICAL_OINTMENT | RECTAL | Status: DC | PRN

## 2017-03-13 MED ORDER — IBUPROFEN 600 MG PO TABS
600.0000 mg | ORAL_TABLET | Freq: Four times a day (QID) | ORAL | Status: DC
  Administered 2017-03-13 – 2017-03-17 (×14): 600 mg via ORAL
  Filled 2017-03-13 (×15): qty 1

## 2017-03-13 MED ORDER — MORPHINE SULFATE (PF) 0.5 MG/ML IJ SOLN
INTRAMUSCULAR | Status: AC
  Filled 2017-03-13: qty 10

## 2017-03-13 MED ORDER — NALOXONE HCL 0.4 MG/ML IJ SOLN
1.0000 ug/kg/h | INTRAVENOUS | Status: DC | PRN
  Filled 2017-03-13: qty 5

## 2017-03-13 MED ORDER — MEPERIDINE HCL 25 MG/ML IJ SOLN
6.2500 mg | INTRAMUSCULAR | Status: DC | PRN
Start: 2017-03-13 — End: 2017-03-17

## 2017-03-13 MED ORDER — PRENATAL MULTIVITAMIN CH
1.0000 | ORAL_TABLET | Freq: Every day | ORAL | Status: DC
  Administered 2017-03-14 – 2017-03-17 (×4): 1 via ORAL
  Filled 2017-03-13 (×4): qty 1

## 2017-03-13 MED ORDER — SCOPOLAMINE 1 MG/3DAYS TD PT72: 1.0000 | MEDICATED_PATCH | Freq: Once | TRANSDERMAL | Status: DC

## 2017-03-13 MED ORDER — OXYTOCIN 10 UNIT/ML IJ SOLN
INTRAMUSCULAR | Status: AC
  Filled 2017-03-13: qty 4

## 2017-03-13 MED ORDER — OXYTOCIN 40 UNITS IN LACTATED RINGERS INFUSION - SIMPLE MED: 2.5000 [IU]/h | INTRAVENOUS | Status: AC

## 2017-03-13 MED ORDER — DIPHENHYDRAMINE HCL 50 MG/ML IJ SOLN: 12.5000 mg | INTRAMUSCULAR | Status: DC | PRN

## 2017-03-13 MED ORDER — TETANUS-DIPHTH-ACELL PERTUSSIS 5-2.5-18.5 LF-MCG/0.5 IM SUSP
0.5000 mL | Freq: Once | INTRAMUSCULAR | Status: AC
  Administered 2017-03-17: 0.5 mL via INTRAMUSCULAR
  Filled 2017-03-13: qty 0.5

## 2017-03-13 MED ORDER — ONDANSETRON HCL 4 MG/2ML IJ SOLN
INTRAMUSCULAR | Status: AC
  Filled 2017-03-13: qty 2

## 2017-03-13 MED ORDER — DEXTROMETHORPHAN POLISTIREX ER 30 MG/5ML PO SUER
30.0000 mg | ORAL | Status: DC | PRN
  Filled 2017-03-13: qty 5

## 2017-03-13 MED ORDER — OXYCODONE HCL 5 MG/5ML PO SOLN: 5.0000 mg | Freq: Once | ORAL | Status: DC | PRN

## 2017-03-13 MED ORDER — KETOROLAC TROMETHAMINE 30 MG/ML IJ SOLN
INTRAMUSCULAR | Status: AC
  Administered 2017-03-13: 30 mg
  Filled 2017-03-13: qty 1

## 2017-03-13 MED ORDER — LACTATED RINGERS IV SOLN
INTRAVENOUS | Status: DC
  Administered 2017-03-13: 11:00:00 via INTRAVENOUS

## 2017-03-13 MED ORDER — BUPIVACAINE HCL (PF) 0.25 % IJ SOLN
INTRAMUSCULAR | Status: AC
  Filled 2017-03-13: qty 30

## 2017-03-13 MED ORDER — NALBUPHINE HCL 10 MG/ML IJ SOLN: 5.0000 mg | INTRAMUSCULAR | Status: DC | PRN

## 2017-03-13 MED ORDER — WITCH HAZEL-GLYCERIN EX PADS: 1.0000 "application " | MEDICATED_PAD | CUTANEOUS | Status: DC | PRN

## 2017-03-13 MED ORDER — FENTANYL CITRATE (PF) 100 MCG/2ML IJ SOLN
INTRAMUSCULAR | Status: AC
  Filled 2017-03-13: qty 2

## 2017-03-13 MED ORDER — SODIUM CHLORIDE 0.9% FLUSH: 3.0000 mL | INTRAVENOUS | Status: DC | PRN

## 2017-03-13 MED ORDER — DEXAMETHASONE SODIUM PHOSPHATE 10 MG/ML IJ SOLN
INTRAMUSCULAR | Status: DC | PRN
  Administered 2017-03-13: 10 mg via INTRAVENOUS

## 2017-03-13 MED ORDER — LACTATED RINGERS IV SOLN
INTRAVENOUS | Status: DC | PRN
  Administered 2017-03-13: 13:00:00 via INTRAVENOUS

## 2017-03-13 MED ORDER — MAGNESIUM SULFATE 40 G IN LACTATED RINGERS - SIMPLE
2.0000 g/h | INTRAVENOUS | Status: AC
  Filled 2017-03-13 (×2): qty 500

## 2017-03-13 MED ORDER — LACTATED RINGERS IV SOLN
INTRAVENOUS | Status: DC | PRN
  Administered 2017-03-13 (×2): via INTRAVENOUS

## 2017-03-13 MED ORDER — ONDANSETRON HCL 4 MG/2ML IJ SOLN
INTRAMUSCULAR | Status: DC | PRN
  Administered 2017-03-13: 4 mg via INTRAVENOUS

## 2017-03-13 MED ORDER — OXYCODONE-ACETAMINOPHEN 5-325 MG PO TABS: 2.0000 | ORAL_TABLET | ORAL | Status: DC | PRN

## 2017-03-13 MED ORDER — SIMETHICONE 80 MG PO CHEW
80.0000 mg | CHEWABLE_TABLET | ORAL | Status: DC
  Administered 2017-03-13 – 2017-03-17 (×4): 80 mg via ORAL
  Filled 2017-03-13 (×4): qty 1

## 2017-03-13 MED ORDER — KETOROLAC TROMETHAMINE 30 MG/ML IJ SOLN: 30.0000 mg | Freq: Once | INTRAMUSCULAR | Status: DC | PRN

## 2017-03-13 MED ORDER — DIPHENHYDRAMINE HCL 25 MG PO CAPS: 25.0000 mg | ORAL_CAPSULE | ORAL | Status: DC | PRN

## 2017-03-13 MED ORDER — NALOXONE HCL 0.4 MG/ML IJ SOLN: 0.4000 mg | INTRAMUSCULAR | Status: DC | PRN

## 2017-03-13 MED ORDER — FENTANYL CITRATE (PF) 100 MCG/2ML IJ SOLN
INTRAMUSCULAR | Status: DC | PRN
  Administered 2017-03-13: 10 ug via INTRATHECAL
  Administered 2017-03-13: 50 ug via INTRAVENOUS
  Administered 2017-03-13: 40 ug via INTRAVENOUS

## 2017-03-13 MED ORDER — DIPHENHYDRAMINE HCL 25 MG PO CAPS: 25.0000 mg | ORAL_CAPSULE | Freq: Four times a day (QID) | ORAL | Status: DC | PRN

## 2017-03-13 MED ORDER — ONDANSETRON HCL 4 MG/2ML IJ SOLN: 4.0000 mg | Freq: Three times a day (TID) | INTRAMUSCULAR | Status: DC | PRN

## 2017-03-13 MED ORDER — SOD CITRATE-CITRIC ACID 500-334 MG/5ML PO SOLN
ORAL | Status: AC
  Administered 2017-03-13: 30 mL
  Filled 2017-03-13: qty 15

## 2017-03-13 MED ORDER — MAGNESIUM SULFATE BOLUS VIA INFUSION
4.0000 g | Freq: Once | INTRAVENOUS | Status: DC
  Filled 2017-03-13: qty 500

## 2017-03-13 MED ORDER — GUAIFENESIN ER 600 MG PO TB12
600.0000 mg | ORAL_TABLET | Freq: Two times a day (BID) | ORAL | Status: DC | PRN
  Filled 2017-03-13: qty 1

## 2017-03-13 MED ORDER — LACTATED RINGERS IV SOLN
INTRAVENOUS | Status: DC
  Administered 2017-03-13 – 2017-03-14 (×2): via INTRAVENOUS

## 2017-03-13 MED ORDER — SIMETHICONE 80 MG PO CHEW: 80.0000 mg | CHEWABLE_TABLET | ORAL | Status: DC | PRN

## 2017-03-13 MED ORDER — PROMETHAZINE HCL 25 MG/ML IJ SOLN: 6.2500 mg | INTRAMUSCULAR | Status: DC | PRN

## 2017-03-13 MED ORDER — ZOLPIDEM TARTRATE 5 MG PO TABS: 5.0000 mg | ORAL_TABLET | Freq: Every evening | ORAL | Status: DC | PRN

## 2017-03-13 MED ORDER — NALBUPHINE HCL 10 MG/ML IJ SOLN: 5.0000 mg | Freq: Once | INTRAMUSCULAR | Status: DC | PRN

## 2017-03-13 MED ORDER — MAGNESIUM SULFATE 40 G IN LACTATED RINGERS - SIMPLE
2.0000 g/h | INTRAVENOUS | Status: DC
  Administered 2017-03-13 – 2017-03-14 (×2): 2 g/h via INTRAVENOUS
  Filled 2017-03-13 (×2): qty 40

## 2017-03-13 MED ORDER — MORPHINE SULFATE (PF) 0.5 MG/ML IJ SOLN
INTRAMUSCULAR | Status: DC | PRN
  Administered 2017-03-13: .2 mg via INTRATHECAL

## 2017-03-13 MED ORDER — SCOPOLAMINE 1 MG/3DAYS TD PT72
MEDICATED_PATCH | TRANSDERMAL | Status: DC | PRN
  Administered 2017-03-13: 1 via TRANSDERMAL

## 2017-03-13 MED ORDER — BUPIVACAINE HCL (PF) 0.25 % IJ SOLN
INTRAMUSCULAR | Status: DC | PRN
  Administered 2017-03-13: 10 mL

## 2017-03-13 MED ORDER — HYDROMORPHONE HCL 1 MG/ML IJ SOLN: 0.2500 mg | INTRAMUSCULAR | Status: DC | PRN

## 2017-03-13 MED ORDER — SIMETHICONE 80 MG PO CHEW
80.0000 mg | CHEWABLE_TABLET | Freq: Three times a day (TID) | ORAL | Status: DC
  Administered 2017-03-13 – 2017-03-17 (×12): 80 mg via ORAL
  Filled 2017-03-13 (×12): qty 1

## 2017-03-13 MED ORDER — MENTHOL 3 MG MT LOZG: 1.0000 | LOZENGE | OROMUCOSAL | Status: DC | PRN

## 2017-03-13 MED ORDER — LACTATED RINGERS IV SOLN
INTRAVENOUS | Status: DC | PRN
  Administered 2017-03-13: 40 [IU] via INTRAVENOUS

## 2017-03-13 MED ORDER — ACETAMINOPHEN 325 MG PO TABS
650.0000 mg | ORAL_TABLET | ORAL | Status: DC | PRN
  Administered 2017-03-16: 650 mg via ORAL
  Filled 2017-03-13: qty 2

## 2017-03-13 MED ORDER — PHENYLEPHRINE 8 MG IN D5W 100 ML (0.08MG/ML) PREMIX OPTIME
INJECTION | INTRAVENOUS | Status: DC | PRN
Start: 2017-03-13 — End: 2017-03-13
  Administered 2017-03-13: 10 ug/min via INTRAVENOUS

## 2017-03-13 MED ORDER — BUPIVACAINE IN DEXTROSE 0.75-8.25 % IT SOLN
INTRATHECAL | Status: DC | PRN
Start: 2017-03-13 — End: 2017-03-13
  Administered 2017-03-13: 1.6 mL via INTRATHECAL

## 2017-03-13 MED ORDER — PHENYLEPHRINE 8 MG IN D5W 100 ML (0.08MG/ML) PREMIX OPTIME
INJECTION | INTRAVENOUS | Status: AC
  Filled 2017-03-13: qty 100

## 2017-03-13 MED ORDER — LABETALOL HCL 100 MG PO TABS
100.0000 mg | ORAL_TABLET | Freq: Three times a day (TID) | ORAL | Status: DC
  Administered 2017-03-13 – 2017-03-14 (×4): 100 mg via ORAL
  Filled 2017-03-13 (×4): qty 1

## 2017-03-13 MED ORDER — LABETALOL HCL 5 MG/ML IV SOLN
20.0000 mg | INTRAVENOUS | Status: AC | PRN
  Administered 2017-03-13: 40 mg via INTRAVENOUS
  Administered 2017-03-13 – 2017-03-15 (×2): 20 mg via INTRAVENOUS
  Filled 2017-03-13: qty 8
  Filled 2017-03-13 (×2): qty 4

## 2017-03-13 MED ORDER — COCONUT OIL OIL
1.0000 "application " | TOPICAL_OIL | Status: DC | PRN
  Administered 2017-03-14: 1 via TOPICAL
  Filled 2017-03-13: qty 120

## 2017-03-13 SURGICAL SUPPLY — 45 items
BARRIER ADHS 3X4 INTERCEED (GAUZE/BANDAGES/DRESSINGS) ×3 IMPLANT
BENZOIN TINCTURE PRP APPL 2/3 (GAUZE/BANDAGES/DRESSINGS) IMPLANT
CHLORAPREP W/TINT 26ML (MISCELLANEOUS) ×3 IMPLANT
CLAMP CORD UMBIL (MISCELLANEOUS) IMPLANT
CLOSURE WOUND 1/2 X4 (GAUZE/BANDAGES/DRESSINGS)
CLOTH BEACON ORANGE TIMEOUT ST (SAFETY) ×3 IMPLANT
CONTAINER PREFILL 10% NBF 15ML (MISCELLANEOUS) IMPLANT
DERMABOND ADVANCED (GAUZE/BANDAGES/DRESSINGS) ×2
DERMABOND ADVANCED .7 DNX12 (GAUZE/BANDAGES/DRESSINGS) ×1 IMPLANT
DRAPE C SECTION CLR SCREEN (DRAPES) ×3 IMPLANT
DRSG OPSITE POSTOP 4X10 (GAUZE/BANDAGES/DRESSINGS) ×3 IMPLANT
ELECT REM PT RETURN 9FT ADLT (ELECTROSURGICAL) ×3
ELECTRODE REM PT RTRN 9FT ADLT (ELECTROSURGICAL) ×1 IMPLANT
EXTRACTOR VACUUM M CUP 4 TUBE (SUCTIONS) IMPLANT
EXTRACTOR VACUUM M CUP 4' TUBE (SUCTIONS)
GLOVE BIOGEL PI IND STRL 7.0 (GLOVE) ×2 IMPLANT
GLOVE BIOGEL PI INDICATOR 7.0 (GLOVE) ×4
GLOVE ECLIPSE 6.5 STRL STRAW (GLOVE) ×3 IMPLANT
GOWN STRL REUS W/TWL LRG LVL3 (GOWN DISPOSABLE) ×6 IMPLANT
KIT ABG SYR 3ML LUER SLIP (SYRINGE) IMPLANT
NEEDLE HYPO 22GX1.5 SAFETY (NEEDLE) ×3 IMPLANT
NEEDLE HYPO 25X5/8 SAFETYGLIDE (NEEDLE) IMPLANT
NS IRRIG 1000ML POUR BTL (IV SOLUTION) ×3 IMPLANT
PACK C SECTION WH (CUSTOM PROCEDURE TRAY) ×3 IMPLANT
PAD OB MATERNITY 4.3X12.25 (PERSONAL CARE ITEMS) ×3 IMPLANT
RTRCTR C-SECT PINK 25CM LRG (MISCELLANEOUS) IMPLANT
STRIP CLOSURE SKIN 1/2X4 (GAUZE/BANDAGES/DRESSINGS) IMPLANT
SUT CHROMIC GUT AB #0 18 (SUTURE) IMPLANT
SUT MNCRL 0 VIOLET CTX 36 (SUTURE) ×3 IMPLANT
SUT MON AB 2-0 SH 27 (SUTURE)
SUT MON AB 2-0 SH27 (SUTURE) IMPLANT
SUT MON AB 3-0 SH 27 (SUTURE)
SUT MON AB 3-0 SH27 (SUTURE) IMPLANT
SUT MON AB 4-0 PS1 27 (SUTURE) IMPLANT
SUT MONOCRYL 0 CTX 36 (SUTURE) ×6
SUT PLAIN 2 0 (SUTURE)
SUT PLAIN 2 0 XLH (SUTURE) IMPLANT
SUT PLAIN ABS 2-0 CT1 27XMFL (SUTURE) IMPLANT
SUT VIC AB 0 CT1 36 (SUTURE) ×6 IMPLANT
SUT VIC AB 2-0 CT1 27 (SUTURE) ×2
SUT VIC AB 2-0 CT1 TAPERPNT 27 (SUTURE) ×1 IMPLANT
SUT VIC AB 4-0 PS2 27 (SUTURE) IMPLANT
SYR CONTROL 10ML LL (SYRINGE) ×3 IMPLANT
TOWEL OR 17X24 6PK STRL BLUE (TOWEL DISPOSABLE) ×3 IMPLANT
TRAY FOLEY BAG SILVER LF 14FR (SET/KITS/TRAYS/PACK) IMPLANT

## 2017-03-13 NOTE — Anesthesia Procedure Notes (Signed)
Spinal  Patient location during procedure: OB Start time: 03/13/2017 12:25 PM End time: 03/13/2017 12:30 PM Staffing Anesthesiologist: Lynda Rainwater, MD Performed: anesthesiologist  Preanesthetic Checklist Completed: patient identified, surgical consent, pre-op evaluation, timeout performed, IV checked, risks and benefits discussed and monitors and equipment checked Spinal Block Patient position: sitting Prep: site prepped and draped and DuraPrep Patient monitoring: heart rate, cardiac monitor, continuous pulse ox and blood pressure Approach: midline Location: L3-4 Injection technique: single-shot Needle Needle type: Pencan  Needle gauge: 24 G Needle length: 10 cm Assessment Sensory level: T4

## 2017-03-13 NOTE — Anesthesia Preprocedure Evaluation (Signed)
Anesthesia Evaluation  Patient identified by MRN, date of birth, ID band Patient awake    Reviewed: Allergy & Precautions, H&P , NPO status , Patient's Chart, lab work & pertinent test results  History of Anesthesia Complications Negative for: history of anesthetic complications  Airway Mallampati: II  TM Distance: >3 FB Neck ROM: full    Dental no notable dental hx. (+) Dental Advisory Given   Pulmonary former smoker,  Takes no meds for asthma, denies having it   Pulmonary exam normal breath sounds clear to auscultation       Cardiovascular Exercise Tolerance: Good (-) hypertension(-) angina(-) CAD negative cardio ROS Normal cardiovascular exam Rhythm:regular Rate:Normal     Neuro/Psych negative neurological ROS  negative psych ROS   GI/Hepatic negative GI ROS, Neg liver ROS,   Endo/Other  negative endocrine ROS  Renal/GU negative Renal ROS     Musculoskeletal negative musculoskeletal ROS (+)   Abdominal   Peds  Hematology negative hematology ROS (+)   Anesthesia Other Findings    Reproductive/Obstetrics negative OB ROS (+) Pregnancy Large 10cm fibroid                             Anesthesia Physical  Anesthesia Plan  ASA: II  Anesthesia Plan: Spinal   Post-op Pain Management:    Induction:   PONV Risk Score and Plan: 2 and Treatment may vary due to age or medical condition and Ondansetron  Airway Management Planned: Natural Airway  Additional Equipment:   Intra-op Plan:   Post-operative Plan:   Informed Consent: I have reviewed the patients History and Physical, chart, labs and discussed the procedure including the risks, benefits and alternatives for the proposed anesthesia with the patient or authorized representative who has indicated his/her understanding and acceptance.   Dental advisory given  Plan Discussed with: Anesthesiologist, CRNA and  Surgeon  Anesthesia Plan Comments:         Anesthesia Quick Evaluation

## 2017-03-13 NOTE — Anesthesia Postprocedure Evaluation (Signed)
Anesthesia Post Note  Patient: Shannon Davenport  Procedure(s) Performed: Primary CESAREAN SECTION (N/A )     Patient location during evaluation: PACU Anesthesia Type: Spinal Level of consciousness: oriented and awake and alert Pain management: pain level controlled Vital Signs Assessment: post-procedure vital signs reviewed and stable Respiratory status: spontaneous breathing and respiratory function stable Cardiovascular status: blood pressure returned to baseline and stable Postop Assessment: no headache, no backache and no apparent nausea or vomiting Anesthetic complications: no    Last Vitals:  Vitals:   03/13/17 1345 03/13/17 1400  BP: (!) 146/95 (!) 158/92  Pulse: 69 68  Resp: 20 (!) 23  Temp:    SpO2: (!) 85% (!) 88%    Last Pain:  Vitals:   03/13/17 1415  TempSrc:   PainSc: 4    Pain Goal: Patients Stated Pain Goal: 0 (03/13/17 0450)               Lynda Rainwater

## 2017-03-13 NOTE — Op Note (Signed)
Cesarean Section Procedure Note  Indications: non-reassuring fetal status  Pre-operative Diagnosis: 32 week 0 day pregnancy.  Post-operative Diagnosis: same  Surgeon: Lovenia Kim   Assistants: Claudette Laws, CNM  Anesthesia: Local anesthesia 0.25.% bupivacaine and Spinal anesthesia  ASA Class: 2  Procedure Details  The patient was seen in the Holding Room. The risks, benefits, complications, treatment options, and expected outcomes were discussed with the patient.  The patient concurred with the proposed plan, giving informed consent. The risks of anesthesia, infection, bleeding and possible injury to other organs discussed. Injury to bowel, bladder, or ureter with possible need for repair discussed. Possible need for transfusion with secondary risks of hepatitis or HIV acquisition discussed. Post operative complications to include but not limited to DVT, PE and Pneumonia noted. The site of surgery properly noted/marked. The patient was taken to Operating Room # 9, identified as Shannon Davenport and the procedure verified as C-Section Delivery. A Time Out was held and the above information confirmed.  After induction of anesthesia, the patient was draped and prepped in the usual sterile manner. A Pfannenstiel incision was made and carried down through the subcutaneous tissue to the fascia. Fascial incision was made and extended transversely using Mayo scissors. The fascia was separated from the underlying rectus tissue superiorly and inferiorly. The peritoneum was identified and entered. Peritoneal incision was extended longitudinally. The utero-vesical peritoneal reflection was incised transversely and the bladder flap was bluntly freed from the lower uterine segment. A low transverse uterine incision(Kerr hysterotomy) was made. Delivered from OA presentation was a  female with Apgar scores of 8 at one minute and 9 at five minutes. Bulb suctioning gently performed. Neonatal team in  attendance.After the umbilical cord was clamped and cut cord blood was obtained for evaluation. The placenta was removed intact and appeared normal. The uterus was curetted with a dry lap pack. Good hemostasis was noted.The uterine outline, tubes and ovaries appeared as noted below. The uterine incision was closed with running locked sutures of 0 Monocryl x 2 layers. Hemostasis was observed. Lavage was carried out until clear.The parietal peritoneum was closed with a running 2-0 Monocryl suture. The fascia was then reapproximated with running sutures of 0 Monocryl. The skin was reapproximated with 3-0 monocryl after [place] closure with 2-0 plain.  Instrument, sponge, and needle counts were correct prior the abdominal closure and at the conclusion of the case.   Findings: Preterm female, posterior placenta, nl left adnexa, absent right tube  Estimated Blood Loss:  500         Drains: foley                 Specimens: placenta                 Complications:  None; patient tolerated the procedure well.         Disposition: PACU - hemodynamically stable.         Condition: stable  Attending Attestation: I performed the procedure.

## 2017-03-13 NOTE — Progress Notes (Signed)
Patient ID: Shannon Davenport, female   DOB: Oct 09, 1980, 36 y.o.   MRN: [id number] CTSP for review FHR tracing BP 137/76 (BP Location: Right Arm)   Pulse 81   Temp 98.3 F (36.8 C) (Oral)   Resp 16   Ht 5\' 7"  (1.702 m)   Wt 98 kg (216 lb 0 oz)   LMP 08/01/2016   SpO2 98%   BMI 33.83 kg/m   FHR tracing 140s, BTBV <5, no accels, Intermittent late , intermittent variable decels. NRFHR- category 3 tracing Severe PEC(BP criteria, labs stable), BMZ complete. Previous myomectomy Recommend to proceed with delivery OR notified. Consent done. NICU aware.

## 2017-03-13 NOTE — Transfer of Care (Signed)
Immediate Anesthesia Transfer of Care Note  Patient: Shannon Davenport  Procedure(s) Performed: Primary CESAREAN SECTION (N/A )  Patient Location: PACU  Anesthesia Type:Spinal  Level of Consciousness: awake and alert   Airway & Oxygen Therapy: Patient Spontanous Breathing  Post-op Assessment: Report given to RN and Post -op Vital signs reviewed and stable  Post vital signs: Reviewed and stable  Last Vitals:  Vitals:   03/13/17 1030 03/13/17 1208  BP: 137/76 (!) 161/102  Pulse: 81 100  Resp: 16   Temp:    SpO2:      Last Pain:  Vitals:   03/13/17 1208  TempSrc:   PainSc: Asleep      Patients Stated Pain Goal: 0 (11/73/56 7014)  Complications: No apparent anesthesia complications

## 2017-03-13 NOTE — Progress Notes (Signed)
Call to Lars Pinks, CNM

## 2017-03-14 ENCOUNTER — Encounter (HOSPITAL_COMMUNITY): Payer: Self-pay | Admitting: Obstetrics and Gynecology

## 2017-03-14 LAB — TYPE AND SCREEN
ABO/RH(D): O POS
ANTIBODY SCREEN: NEGATIVE

## 2017-03-14 LAB — COMPREHENSIVE METABOLIC PANEL
ALT: 23 U/L (ref 14–54)
ANION GAP: 8 (ref 5–15)
AST: 38 U/L (ref 15–41)
Albumin: 2.2 g/dL — ABNORMAL LOW (ref 3.5–5.0)
Alkaline Phosphatase: 123 U/L (ref 38–126)
BILIRUBIN TOTAL: 0.3 mg/dL (ref 0.3–1.2)
BUN: 11 mg/dL (ref 6–20)
CALCIUM: 7.2 mg/dL — AB (ref 8.9–10.3)
CO2: 23 mmol/L (ref 22–32)
CREATININE: 0.79 mg/dL (ref 0.44–1.00)
Chloride: 101 mmol/L (ref 101–111)
GFR calc Af Amer: >60 mL/min (ref >60)
GFR calc non Af Amer: >60 mL/min (ref >60)
GLUCOSE: 97 mg/dL (ref 65–99)
Potassium: 4.8 mmol/L (ref 3.5–5.1)
SODIUM: 132 mmol/L — AB (ref 135–145)
TOTAL PROTEIN: 5.2 g/dL — AB (ref 6.5–8.1)

## 2017-03-14 LAB — CBC
HEMATOCRIT: 32.1 % — AB (ref 36.0–46.0)
Hemoglobin: 10.7 g/dL — ABNORMAL LOW (ref 12.0–15.0)
MCH: 30.3 pg (ref 26.0–34.0)
MCHC: 33.3 g/dL (ref 30.0–36.0)
MCV: 90.9 fL (ref 78.0–100.0)
PLATELETS: 173 10*3/uL (ref 150–400)
RBC: 3.53 MIL/uL — AB (ref 3.87–5.11)
RDW: 14.6 % (ref 11.5–15.5)
WBC: 17.7 10*3/uL — AB (ref 4.0–10.5)

## 2017-03-14 MED ORDER — HYDROCHLOROTHIAZIDE 12.5 MG PO CAPS
12.5000 mg | ORAL_CAPSULE | Freq: Every day | ORAL | Status: DC
  Administered 2017-03-14 – 2017-03-17 (×4): 12.5 mg via ORAL
  Filled 2017-03-14 (×5): qty 1

## 2017-03-14 NOTE — Addendum Note (Signed)
Addendum  created 03/14/17 0905 by Rayvon Char, CRNA   Sign clinical note

## 2017-03-14 NOTE — Lactation Note (Signed)
This note was copied from a baby's chart. Lactation Consultation Note  Patient Name: Shannon Davenport [id number] Date: 03/14/2017 Reason for consult: Initial assessment;NICU baby;Preterm <34wks;Other (Comment)(lactation induction / DEBP )  Baby is heading to NICU, is post MagSo4.  Per mom heading to NICU and feeling good. Has pumped with success. [place] praised her and enc to continue.  Moms sister and friend in room and she was ok with them being there for discussion.  LC reviewed supply and demand, and mentioned to mom to think of a pumping as a feeding , may help her relax.  Also hand expressing. Mom already has colostrum collectors.  Per mom has her DEBP at home.  Mother informed of post-discharge support and given phone number to the lactation department, including services for phone call assistance; out-patient appointments; and breastfeeding support group. List of other breastfeeding resources in the community given in the handout. Encouraged mother to call for problems or concerns related to breastfeeding.    Maternal Data Does the patient have breastfeeding experience prior to this delivery?: No  Feeding    LATCH Score                   Interventions Interventions: Breast feeding basics reviewed;DEBP  Lactation Tools Discussed/Used Tools: Pump Breast pump type: Double-Electric Breast Pump Pump Review: Setup, frequency, and cleaning(per mom 12/15 and has pumped often with EBM yield )   Consult Status Consult Status: Follow-up Date: 03/14/17 Follow-up type: In-patient    [place] 03/14/2017, 3:14 PM

## 2017-03-14 NOTE — Anesthesia Postprocedure Evaluation (Signed)
Anesthesia Post Note  Patient: [name]  Procedure(s) Performed: Primary CESAREAN SECTION (N/A )     Patient location during evaluation: [facility] Anesthesia Type: Spinal Level of consciousness: oriented and awake and alert Pain management: pain level controlled Vital Signs Assessment: post-procedure vital signs reviewed and stable Respiratory status: spontaneous breathing, respiratory function stable and patient connected to nasal cannula oxygen Cardiovascular status: blood pressure returned to baseline and stable Postop Assessment: no headache, no backache, no apparent nausea or vomiting and patient able to bend at knees Anesthetic complications: no    Last Vitals:  Vitals:   03/14/17 0405 03/14/17 0840  BP: (!) 142/92 (!) 146/87  Pulse: 67 69  Resp: 16 18  Temp: 36.8 C 37 C  SpO2: 95% 98%    Last Pain:  Vitals:   03/14/17 0840  TempSrc: Oral  PainSc:    Pain Goal: Patients Stated Pain Goal: 3 (03/14/17 0800)               Rayvon Char

## 2017-03-14 NOTE — Progress Notes (Addendum)
Subjective: S/P C/S 32 wks - PEC w/ severe features, NRFHT POD# 1 Information for the patient's newborn:  Shannon Davenport, Shannon Davenport [[id number]]  female NICU for prematurity Baby name: Shannon Davenport  Reports feeling well, no PEC s/s Feeding: breast / pumping  Patient reports tolerating PO.  Breast symptoms: + colostrum Pain controlled with ibuprofen (OTC), one percocet overnight Denies HA/SOB/C/P/N/V/dizziness. Flatus just started. She reports vaginal bleeding as normal, without clots.  She is ambulating, urinating without difficulty.     Objective:   VS:    Vitals:   03/14/17 0330 03/14/17 0400 03/14/17 0405 03/14/17 0840  BP:   (!) 142/92 (!) 146/87  Pulse:   67 69  Resp: 16  16 18   Temp:   98.3 F (36.8 C) 98.6 F (37 C)  TempSrc:   Oral Oral  SpO2: 95% 95% 95% 98%  Weight:      Height:          Intake/Output Summary (Last 24 hours) at 03/14/2017 1006 Last data filed at 03/14/2017 0810 Gross per 24 hour  Intake 5005 ml  Output 3902 ml  Net 1103 ml     Mag Sulfate 2 gm/hr infusing   CBC Latest Ref Rng & Units 03/14/2017 03/13/2017 03/12/2017  WBC 4.0 - 10.5 K/uL 17.7(H) 12.3(H) 12.0(H)  Hemoglobin 12.0 - 15.0 g/dL 10.7(L) 11.7(L) 12.8  Hematocrit 36.0 - 46.0 % 32.1(L) 35.4(L) 38.6  Platelets 150 - 400 K/uL 173 210 238   CMP Latest Ref Rng & Units 03/14/2017 03/13/2017 03/12/2017  Glucose 65 - 99 mg/dL 97 101(H) 128(H)  BUN 6 - 20 mg/dL 11 10 11   Creatinine 0.44 - 1.00 mg/dL 0.79 0.67 0.77  Sodium 135 - 145 mmol/L 132(L) 133(L) 133(L)  Potassium 3.5 - 5.1 mmol/L 4.8 4.0 4.1  Chloride 101 - 111 mmol/L 101 104 106  CO2 22 - 32 mmol/L 23 20(L) 19(L)  Calcium 8.9 - 10.3 mg/dL 7.2(L) 7.1(L) 7.2(L)  Total Protein 6.5 - 8.1 g/dL 5.2(L) 6.0(L) 6.8  Total Bilirubin 0.3 - 1.2 mg/dL 0.3 0.1(L) 0.2(L)  Alkaline Phos 38 - 126 U/L 123 152(H) 174(H)  AST 15 - 41 U/L 38 30 26  ALT 14 - 54 U/L 23 17 15     Blood type: --/--/O POS (12/16 0258)  Rubella: Immune (07/17 0000)      Physical Exam:  General: alert, cooperative and no distress CV: Regular rate and rhythm Resp: clear Abdomen: soft, nontender, hypoactive bowel sounds Incision: dry, intact and serous and small area drainage present Uterine Fundus: firm, below umbilicus, nontender Lochia: minimal Ext: no edema, redness or tenderness in the calves or thighs      Assessment/Plan: 36 y.o.   POD# 1. [id number]                  Principal Problem:   Postpartum care following cesarean delivery (12/15) Active Problems:   Severe pre-eclampsia - resolving   Cesarean delivery delivered: indication: severe pre-eclampsia, Category 3 fetal tracing, hx. of myomectomy   Doing well, stable.    BP mild range on labetalol 100 mg TID  - severe range BP last night (162/103, 166/96), resolved with IV labetalol x 2 doses Mag Sulfate therapy - DC at 24 hrs PP (at 1400 today)           Advance diet as tolerated LC support for pumping / NICU newborn Encourage to ambulate, warm fluids for gut motility Routine post-op care  POC discussed w/ Dr. Penelope Coop,  CNM, MSN 03/14/2017, 10:06 AM   Reviewed late morning / afternoon vitals and I&O BP remains mild range and suboptimal UO.  Will add HCTZ 12.5 daily to regimen Continue monitoring I&O closely  Shannon Pina, MSN, CNM 03/14/2017, 4:15 PM

## 2017-03-15 MED ORDER — LABETALOL HCL 100 MG PO TABS
100.0000 mg | ORAL_TABLET | Freq: Three times a day (TID) | ORAL | Status: DC
  Administered 2017-03-15 – 2017-03-17 (×8): 100 mg via ORAL
  Filled 2017-03-15 (×8): qty 1

## 2017-03-15 MED ORDER — NIFEDIPINE ER OSMOTIC RELEASE 30 MG PO TB24: 30.0000 mg | ORAL_TABLET | Freq: Every day | ORAL | Status: DC

## 2017-03-15 MED ORDER — NIFEDIPINE ER OSMOTIC RELEASE 30 MG PO TB24
30.0000 mg | ORAL_TABLET | Freq: Every day | ORAL | Status: DC
  Administered 2017-03-15 – 2017-03-17 (×3): 30 mg via ORAL
  Filled 2017-03-15 (×3): qty 1

## 2017-03-15 NOTE — Progress Notes (Addendum)
POSTOPERATIVE DAY # 2 S/P Primary LTCS for severe pre-eclampsia at 32 weeks, hx. Of myomectomy, baby girl "Jae"   S:         Reports feeling okay.  Concerned for increase in swelling in lower extremities.              Tolerating po intake / no nausea / no vomiting / + flatus / + BM  Denies HA, visual changes, RUQ/epigastric pain, dizziness, SOB, or CP             Bleeding is light, moderate             Pain controlled with Motrin and Percocet             Up ad lib / ambulatory/ voiding QS  Newborn in NICU due to prematurity - doing well per mom.  She is pumping colostrum.    O:  VS: BP (!) 150/94 (BP Location: Right Arm)   Pulse 70   Temp 98.7 F (37.1 C) (Oral)   Resp 18   Ht 5\' 7"  (1.702 m)   Wt 95.7 kg (211 lb 0.1 oz)   LMP 08/01/2016   SpO2 98%   Breastfeeding? Unknown   BMI 33.05 kg/m    LABS:                 CBC Latest Ref Rng & Units 03/14/2017 03/13/2017 03/12/2017  WBC 4.0 - 10.5 K/uL 17.7(H) 12.3(H) 12.0(H)  Hemoglobin 12.0 - 15.0 g/dL 10.7(L) 11.7(L) 12.8  Hematocrit 36.0 - 46.0 % 32.1(L) 35.4(L) 38.6  Platelets 150 - 400 K/uL 173 210 238   CMP Latest Ref Rng & Units 03/14/2017 03/13/2017 03/12/2017  Glucose 65 - 99 mg/dL 97 101(H) 128(H)  BUN 6 - 20 mg/dL 11 10 11   Creatinine 0.44 - 1.00 mg/dL 0.79 0.67 0.77  Sodium 135 - 145 mmol/L 132(L) 133(L) 133(L)  Potassium 3.5 - 5.1 mmol/L 4.8 4.0 4.1  Chloride 101 - 111 mmol/L 101 104 106  CO2 22 - 32 mmol/L 23 20(L) 19(L)  Calcium 8.9 - 10.3 mg/dL 7.2(L) 7.1(L) 7.2(L)  Total Protein 6.5 - 8.1 g/dL 5.2(L) 6.0(L) 6.8  Total Bilirubin 0.3 - 1.2 mg/dL 0.3 0.1(L) 0.2(L)  Alkaline Phos 38 - 126 U/L 123 152(H) 174(H)  AST 15 - 41 U/L 38 30 26  ALT 14 - 54 U/L 23 17 15                Bloodtype: --/--/O POS (12/16 0538)  Rubella: Immune (07/17 0000)                                             I&O: Intake/Output      12/16 0701 - 12/17 0700 12/17 0701 - 12/18 0700   P.O. 480    I.V. (mL/kg) 875 (9)    Total  Intake(mL/kg) 1355 (13.9)    Urine (mL/kg/hr) 4950 (2.1)    Blood     Total Output 4950    Net -3595          Since admission: Net +604.7  Weight today: 211#, down 4 pounds since 12/16             Physical Exam:             Alert and Oriented X3  Lungs: Clear and unlabored  Heart: regular rate and rhythm /  no murmurs  Abdomen: soft, non-tender, non-distended, active bowel sounds in all quadrants             Fundus: firm, non-tender, U-3             Dressing: honeycomb dsg with Dermabond, left side old sanguinous drainage noted.              Incision:  approximated with sutures / no erythema / no ecchymosis / no drainage  Perineum: intact  Lochia: appropriate, no clots   Extremities: +2 BLE edema, no calf pain or tenderness,   A/P:    POD # 2 S/P Primary LTCS for severe pre-eclampsia at 32 weeks               - Routine postoperative care                - BPs elevated, severe range overnight    - Received 1 dose of IV Labetalol overnight     - Current Medications: Labetalol 100mg  TID; HCTZ 12.5mg  daily   - Will add Procardia 30mg  XL daily    - Encouraged to elevate feet while resting     - May shower today     - Continue strict I&Os, daily weights     - Possible discharge home tomorrow if BPs trend down  ABL Anemia - stable, asymptomatic   Consult for plan of care: Dr. [place] Sink, MSN, CNM [facility]   Addendum MD note- pt reviewed with CNM, chart reviewed. Adding Procardia 30mg  XL.  Manon Hilding, MD

## 2017-03-15 NOTE — Lactation Note (Signed)
This note was copied from a baby's chart. Lactation Consultation Note  Patient Name: Shannon Davenport [id number] Date: 03/15/2017 Reason for consult: Follow-up assessment;NICU baby;Late-preterm 34-36.6wks;Other (Comment)(lactation induction, ) Baby is 38 hours old,  Per mom her blood pressure has been up, and she is on 2 B/P medications.  Per mom the volume is increasing. LC mentioned when the 3rd volume is 20 ml or greater to  Change to mainteance mode. Per mom has pumped every 3 hours.  [place] praised mom for her efforts and also encouraged rest/ naps.  Per mom has a DEBP at home.   Maternal Data Has patient been taught Hand Expression?: Yes  Feeding Feeding Type: Breast Milk Length of feed: 15 min  LATCH Score                   Interventions Interventions: Breast feeding basics reviewed;DEBP  Lactation Tools Discussed/Used Tools: Pump Breast pump type: Double-Electric Breast Pump WIC Program: No Pump Review: Setup, frequency, and cleaning(LC reviewed and reminded mom to go to maintence mode once the volume increases tne 3 time )   Consult Status Consult Status: Follow-up Date: 03/16/17 Follow-up type: In-patient    [place] 03/15/2017, 3:36 PM

## 2017-03-16 NOTE — Lactation Note (Signed)
This note was copied from a baby's chart. Lactation Consultation Note  Patient Name: Girl Nessa Ramaker SEGBT'D Date: 03/16/2017 Reason for consult: Follow-up assessment Baby at 21 hr of life. Mom reports using the DEBP q2hr, suggested she pump 8-12x/24hr. She stated her breast still feel hard after 20 minutes of pumping. Encouraged her to use the Initiation setting until she gets >69ml per breast x3. She can use ice packs on the breast for 15 minutes prior to pumping and manually express immediatly following pumping. Mom does not think she will be going home today. She has a Medela DEBP at home and her sister "who is a breastfeeder/pumper will help me". She is aware of lactation services and support group.   Maternal Data    Feeding Feeding Type: Breast Milk Length of feed: 30 min  LATCH Score                   Interventions Interventions: DEBP;Hand pump;Hand express  Lactation Tools Discussed/Used     Consult Status Consult Status: Follow-up Date: 03/17/17 Follow-up type: In-patient    Denzil Hughes 03/16/2017, 12:51 PM

## 2017-03-16 NOTE — Progress Notes (Signed)
POSTOPERATIVE DAY # 3 S/P Primary LTCS for severe pre-eclampsia at 32 weeks, hx. Of myomectomy, baby girl "Jae"  S:         Reports feeling better today, "less tight and swollen"             Tolerating po intake / no nausea / no vomiting / + flatus / + BM  Reports a slight headache since starting Procardia yesterday, no visual scotoma, no RUQ/epigastric pain  Denies dizziness, SOB, or CP             Bleeding is light             Pain controlled withMotrin and Percocet             Up ad lib / ambulatory/ voiding QS   Newborn in NICU due to prematurity - doing well per mom.  She is pumping colostrum.    O:  VS: BP 124/77 (BP Location: Left Arm)   Pulse 78   Temp 98.5 F (36.9 C) (Oral)   Resp 18   Ht 5\' 7"  (1.702 m)   Wt 95.7 kg (211 lb 0.1 oz)   LMP 08/01/2016   SpO2 98%   Breastfeeding? Unknown   BMI 33.05 kg/m  Vitals:   03/15/17 2350 03/16/17 0612 03/16/17 0613 03/16/17 0800  BP: (!) 156/97 138/85  124/77  Pulse: 98 80  78  Resp: 19 18  18   Temp: 98.9 F (37.2 C)   98.5 F (36.9 C)  TempSrc: Oral   Oral  SpO2: 98%  99% 98%  Weight:      Height:        LABS:               Recent Labs    03/14/17 0538  WBC 17.7*  HGB 10.7*  PLT 173               Bloodtype: --/--/O POS (12/16 0538)  Rubella: Immune (07/17 0000)                                             I&O: Intake/Output      12/17 0701 - 12/18 0700 12/18 0701 - 12/19 0700   P.O. 1560    I.V. (mL/kg)     Total Intake(mL/kg) 1560 (16.3)    Urine (mL/kg/hr) 5600 (2.4)    Total Output 5600    Net -4040         Urine Occurrence 1 x    Net Negative 3435 since admission  Weight not done today              Physical Exam:             Alert and Oriented X3  Lungs: Clear and unlabored  Heart: regular rate and rhythm / no murmurs  Abdomen: soft, non-tender, non-distended, active bowel sounds             Fundus: firm, non-tender, U-3             Dressing: honeycomb dsg with Dermabond, left side old sanguinous  drainage noted.             Incision:  approximated with sutures / no erythema / no ecchymosis / no drainage  Perineum: intact   Lochia: appropriate, no clots   Extremities: +1 BLE edema, no calf pain or tenderness,  compression socks on  A/P:        POD # 3 S/P Primary LTCS for severe pre-eclampsia at 32 weeks               - Routine postoperative care                - BPs elevated overnight 156/97 x 2                                            - Current Medications: Labetalol 100mg  TID; HCTZ 12.5mg  daily; Procardia 30mg  XL daily               - Encouraged to elevate feet while resting                - May shower today                - Continue strict I&Os, daily weights                - Re-evaluate blood pressures late this afternoon, possible late discharge this evening or in AM             Mild ABL Anemia - stable, asymptomatic   Consult for plan of care: Dr. Janae Sauce, MSN, CNM [facility]

## 2017-03-16 NOTE — Progress Notes (Signed)
Interval note:  Reviewed hospital course and current blood pressures with Dr. Murrell Redden.  She recommends discharge home tomorrow after 24 hours of normal BPs.  Terri, RN notified to update pt. On plan of care.   Lars Pinks, CNM

## 2017-03-17 MED ORDER — LABETALOL HCL 100 MG PO TABS: 100.0000 mg | ORAL_TABLET | Freq: Three times a day (TID) | ORAL | 0 refills | Status: DC

## 2017-03-17 MED ORDER — NIFEDIPINE ER 30 MG PO TB24: 30.0000 mg | ORAL_TABLET | Freq: Every day | ORAL | 0 refills | Status: DC

## 2017-03-17 MED ORDER — HYDROCHLOROTHIAZIDE 12.5 MG PO CAPS: 12.5000 mg | ORAL_CAPSULE | Freq: Every day | ORAL | 0 refills | Status: DC

## 2017-03-17 MED ORDER — OXYCODONE-ACETAMINOPHEN 5-325 MG PO TABS: 1.0000 | ORAL_TABLET | ORAL | 0 refills | Status: DC | PRN

## 2017-03-17 MED ORDER — IBUPROFEN 600 MG PO TABS: 600.0000 mg | ORAL_TABLET | Freq: Four times a day (QID) | ORAL | 0 refills | Status: DC

## 2017-03-17 NOTE — Progress Notes (Signed)
POSTOPERATIVE DAY # 4 S/P CS @ 32 wk for severe PEC   S:         Reports feeling well - minimal pain / No PIH symptoms             Tolerating po intake / no nausea / no vomiting / + flatus / + BM             Bleeding is light             Pain controlled with motrin             Up ad lib / ambulatory/ voiding QS  Newborn in NICU - stable   O:  VS: BP 135/87   Pulse 78   Temp 98.6 F (37 C) (Oral)   Resp 17   Ht 5\' 7"  (1.702 m)   Wt 95.7 kg (211 lb 0.1 oz)   LMP 08/01/2016   SpO2 99%   Breastfeeding? Unknown   BMI 33.05 kg/m                BP: 135/87 - 140/87 - 155/86 - 129/74 - 139/85 - 124/77 - 135/85   LABS:              Bloodtype: --/--/O POS (12/16 0538)  Rubella: Immune (07/17 0000)                                            I&O: Intake/Output      12/18 0701 - 12/19 0700 12/19 0701 - 12/20 0700   P.O. 720    Total Intake(mL/kg) 720 (7.5)    Urine (mL/kg/hr) 5175 (2.3)    Total Output 5175    Net -4455                    Physical Exam:             Alert and Oriented X3  Lungs: Clear and unlabored  Heart: regular rate and rhythm / no mumurs  Abdomen: soft, non-tender, non-distended              Fundus: firm, non-tender, U-2             Dressing intact              Incision:  approximated with suture / no erythema / no ecchymosis / dried drainage  Perineum: intact  Lochia: light-scant  Extremities: trace edema, no calf pain or tenderness, negative Homans  A:        POD # 4 S/P CS (32 weeks-newborn in NICU)            Severe preeclampsia - BP stable / diuresing well  P:        Routine postoperative care              DC home             OV 1 week for BP recheck   Artelia Laroche CNM, MSN, [place] 03/17/2017, 8:52 AM

## 2017-03-17 NOTE — Progress Notes (Signed)
Patient discharged home with family. Discharge instructions, teaching, home care, prescriptions, and follow-up appts reviewed. Reasons to seek care and PIH discussed. Pt verbalized understanding. No further questions at this time.

## 2017-03-17 NOTE — Discharge Summary (Signed)
OB Discharge Summary  Patient Name: Shannon Davenport DOB: [birth year] MRN: [id number]  Date of admission: 03/10/2017  Admitting diagnosis: 47.4WKS HBP Intrauterine pregnancy: [redacted]w[redacted]d     Secondary diagnosis: Preeclampsia   Date of discharge: 03/17/2017    Discharge diagnosis: Preterm Pregnancy Delivered at 32 weeks / severe preeclampsia - resolving / POD 4 s/p primary CS   Prenatal history: [id number]   EDC : 05/08/2017, by Last Menstrual Period  Prenatal care at [facility]  Primary provider : Mody Prenatal course complicated by hx myomectomy / abnormal genetic testing / growth restriction / gestational hypertension  Prenatal Labs: ABO, Rh: --/--/O POS (12/16 6010)  Antibody: NEG (12/16 0538) Rubella: Immune (07/17 0000)   RPR: Nonreactive (07/17 0000)  HBsAg: Negative (07/17 0000)  HIV: Non-reactive (07/17 0000)                              Hospital course:  Sceduled C/S   36 y.o. yo [id number] at [redacted]w[redacted]d was admitted to the hospital 03/10/2017 for scheduled cesarean section with the following indication:severe preeclampsia.  Membrane Rupture Time/Date: 12:51 PM ,03/13/2017   Patient delivered a Viable infant.03/13/2017   Details of operation can be found in separate operative note.  Pateint had an uncomplicated postpartum course.  She is ambulating, tolerating a regular diet, passing flatus, and urinating well. Patient is discharged home in stable condition on  03/17/17. Magnesium sulfate prophylaxis until POD 2 for severe preeclampsia. Labetalol and Procardia and HCTZ for blood pressure management. Blood pressure stable and good diuresis prior to discharge.        Delivering PROVIDER: Brien Few                                                            Complications: None  Newborn Data: Live born female  Birth Weight: 2 lb 11.7 oz (1240 g) APGAR: 8, 9  Newborn Delivery   Birth date/time:  03/13/2017 12:53:00 Delivery type:  C-Section, Low  Transverse C-section categorization:  Primary     Baby Feeding: Bottle and Breast Disposition:NICU  Post partum procedures:none  Labs: Lab Results  Component Value Date   WBC 17.7 (H) 03/14/2017   HGB 10.7 (L) 03/14/2017   HCT 32.1 (L) 03/14/2017   MCV 90.9 03/14/2017   PLT 173 03/14/2017   CMP Latest Ref Rng & Units 03/14/2017  Glucose 65 - 99 mg/dL 97  BUN 6 - 20 mg/dL 11  Creatinine 0.44 - 1.00 mg/dL 0.79  Sodium 135 - 145 mmol/L 132(L)  Potassium 3.5 - 5.1 mmol/L 4.8  Chloride 101 - 111 mmol/L 101  CO2 22 - 32 mmol/L 23  Calcium 8.9 - 10.3 mg/dL 7.2(L)  Total Protein 6.5 - 8.1 g/dL 5.2(L)  Total Bilirubin 0.3 - 1.2 mg/dL 0.3  Alkaline Phos 38 - 126 U/L 123  AST 15 - 41 U/L 38  ALT 14 - 54 U/L 23      Physical Exam @ time of discharge:  Vitals:   03/17/17 0014 03/17/17 0447 03/17/17 0509 03/17/17 1055  BP: 140/87  135/87 135/89  Pulse: 78 78  93  Resp: 18 17  16   Temp: 98.4 F (36.9 C) 98.6 F (37 C)  TempSrc: Oral Oral    SpO2: 98% 99%  99%  Weight:      Height:        General: alert, cooperative and no distress Lochia: appropriate Uterine Fundus: firm Perineum: intact Incision: Healing well with no significant drainage Extremities: DVT Evaluation: No evidence of DVT seen on physical exam.   Discharge instructions:  "Baby and Me Booklet" and [address] Booklet  Discharge Medications:  Allergies as of 03/17/2017   No Known Allergies     Medication List    TAKE these medications   acetaminophen 500 MG tablet Commonly known as:  TYLENOL Take 500 mg by mouth every 6 (six) hours as needed for moderate pain.   DELSYM 30 MG/5ML liquid Generic drug:  dextromethorphan Take 30 mg by mouth as needed for cough.   guaiFENesin 600 MG 12 hr tablet Commonly known as:  MUCINEX Take 600 mg by mouth 2 (two) times daily as needed for cough.   hydrochlorothiazide 12.5 MG capsule Commonly known as:  MICROZIDE Take 1 capsule (12.5 mg total) by mouth  daily. Start taking on:  03/18/2017   ibuprofen 600 MG tablet Commonly known as:  ADVIL,MOTRIN Take 1 tablet (600 mg total) by mouth every 6 (six) hours.   labetalol 100 MG tablet Commonly known as:  NORMODYNE Take 1 tablet (100 mg total) by mouth every 8 (eight) hours.   NIFEdipine 30 MG 24 hr tablet Commonly known as:  PROCARDIA-XL/ADALAT CC Take 1 tablet (30 mg total) by mouth daily. Start taking on:  03/18/2017   oxyCODONE-acetaminophen 5-325 MG tablet Commonly known as:  PERCOCET/ROXICET Take 1 tablet by mouth every 4 (four) hours as needed (pain scale 4-7).   prenatal multivitamin Tabs tablet Take 1 tablet by mouth daily at 12 noon.            Discharge Care Instructions  (From admission, onward)        Start     Ordered   03/17/17 0000  Discharge wound care:    Comments:  Keep incision clean and dry   03/17/17 1330      Diet: routine diet  Activity: Advance as tolerated. Pelvic rest x 6 weeks.   Follow up:1 week for BP recheck and medication adjustment                     6 weeks PP exam    Signed: Artelia Laroche CNM, MSN, [facility] 03/17/2017, 1:31 PM

## 2017-04-01 ENCOUNTER — Ambulatory Visit: Payer: Self-pay

## 2017-04-01 NOTE — Lactation Note (Signed)
This note was copied from a baby's chart. Lactation Consultation Note  Patient Name: Shannon Davenport UXNAT'F Date: 04/01/2017 Reason for consult: Follow-up assessment;NICU baby;Preterm <34wks;Infant < 6lbs   Follow up with mom in NICU at mom and OT/PT. Mom has been putting infant to the breast a few times. Infant has latched briefly at times.   Infant was awake and was placed on pillows to right breast. Mom did well with using the cross cradle hold to latch infant. Infant was not interested in latching and showed very little effort to latch at this time.   Enc mom to continue offering the breast when infant is asking. Discussed feeding behaviors in the preterm infant. Enc mom to initially latch infant to partially empty breast for a few times before offering a full breast, mom voiced understanding.   Mom to call for assistance as needed. Mom is pumping and has a great supply. She has no questions/concerns at this time.    Maternal Data Has patient been taught Hand Expression?: Yes Does the patient have breastfeeding experience prior to this delivery?: No  Feeding Feeding Type: Breast Fed Nipple Type: Dr. Myra Gianotti Preemie Length of feed: 30 min  LATCH Score Latch: Too sleepy or reluctant, no latch achieved, no sucking elicited.  Audible Swallowing: None  Type of Nipple: Everted at rest and after stimulation  Comfort (Breast/Nipple): Soft / non-tender  Hold (Positioning): Assistance needed to correctly position infant at breast and maintain latch.  LATCH Score: 5  Interventions Interventions: Breast feeding basics reviewed;Adjust position;Assisted with latch;Support pillows;Skin to skin;Position options;Breast massage;Breast compression;Expressed milk  Lactation Tools Discussed/Used Breast pump type: Double-Electric Breast Pump   Consult Status Consult Status: PRN Follow-up type: Call as needed    Donn Pierini 04/01/2017, 12:36 PM

## 2017-04-21 ENCOUNTER — Inpatient Hospital Stay (HOSPITAL_COMMUNITY): Admit: 2017-04-21 | Payer: Self-pay | Admitting: Obstetrics and Gynecology

## 2017-09-24 IMAGING — US US MFM OB DETAIL+14 WK
1 series · 13 of 28 positions shown · non-contrast
Comparison: none

[Series 1: us mfm ob detail+14 wk · 71 acquisitions, 13 frames shown]
[im 3/71]
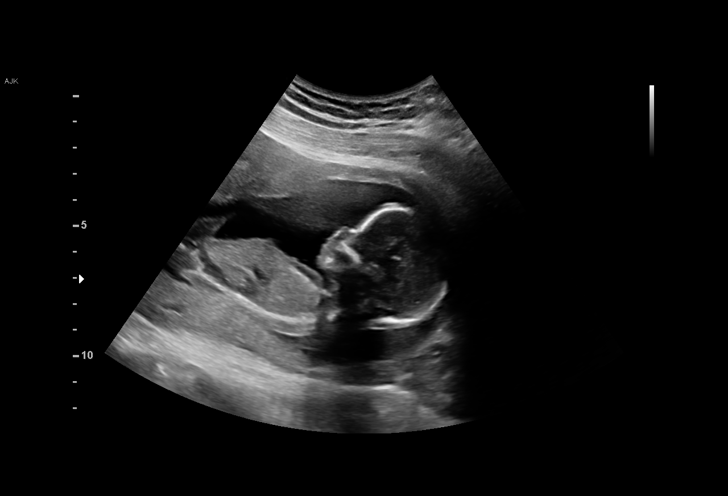
[im 8/71]
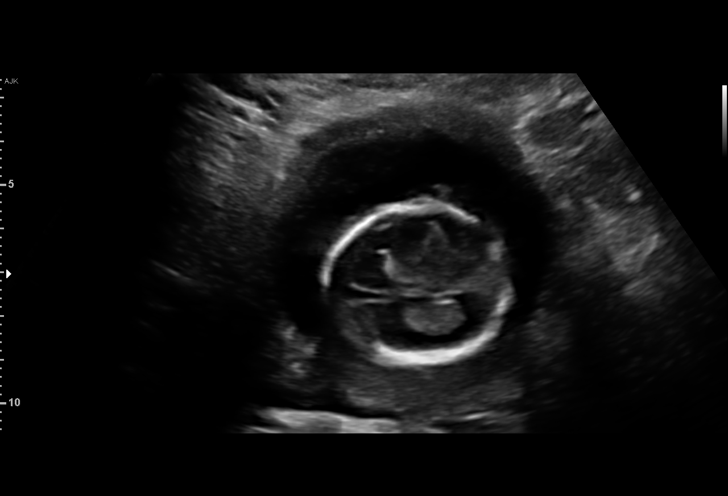
[im 13/71]
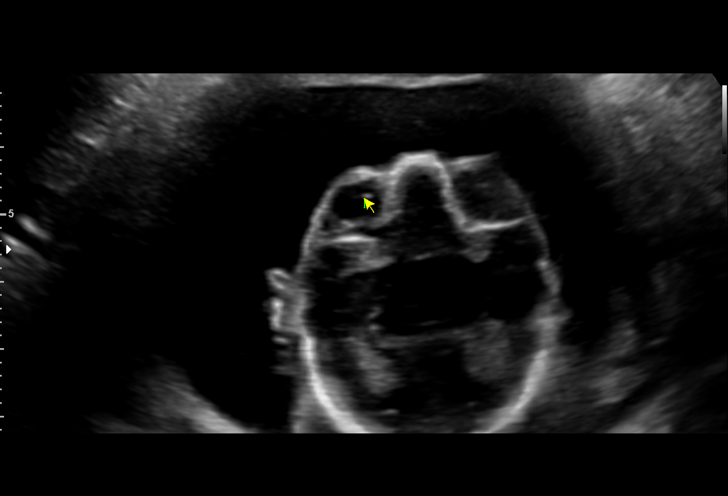
[im 19/71]
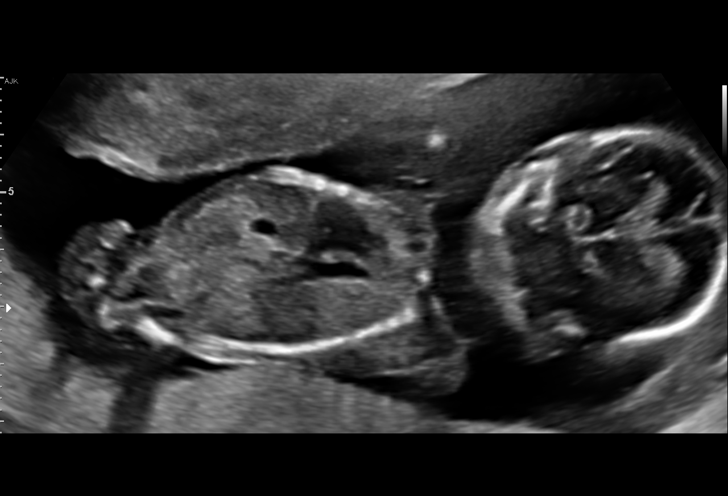
[im 24/71]
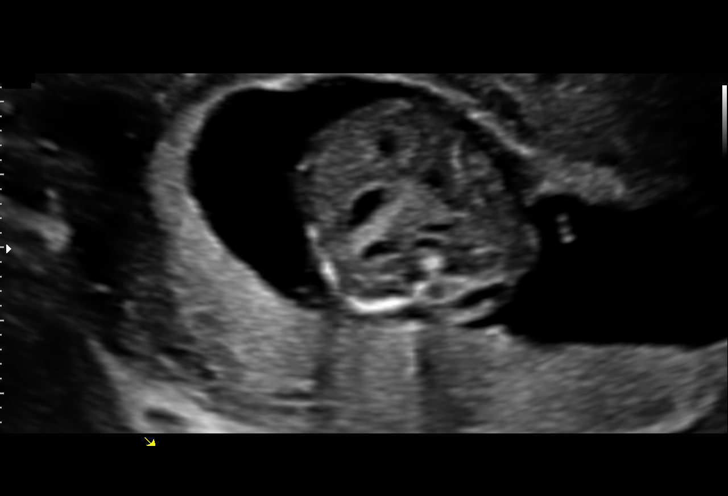
[im 29/71]
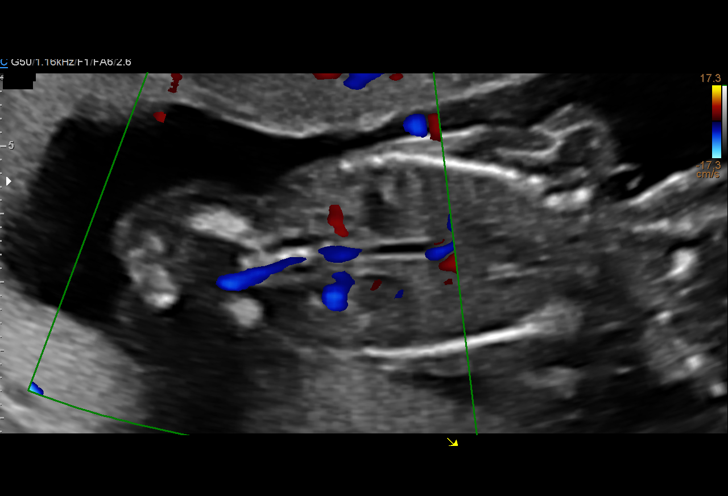
[im 37/71]
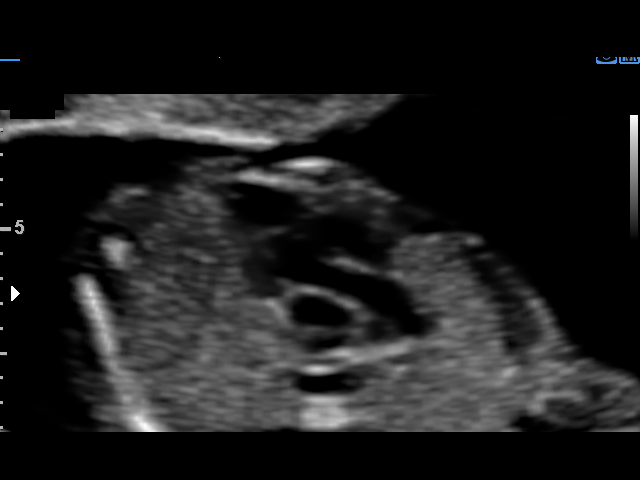
[im 42/71]
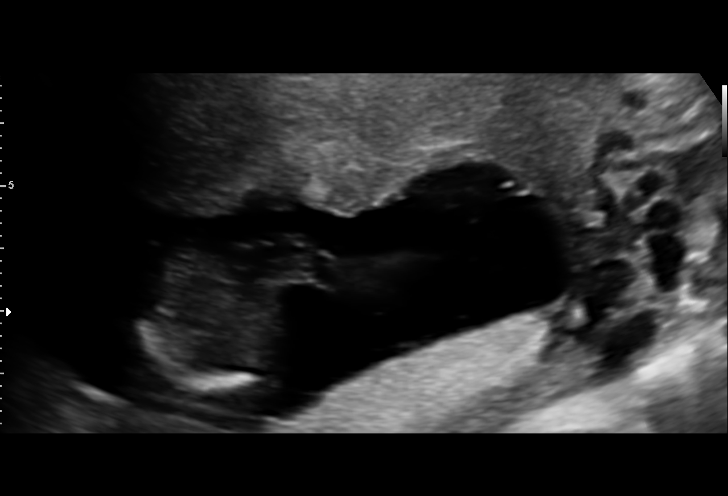
[im 47/71]
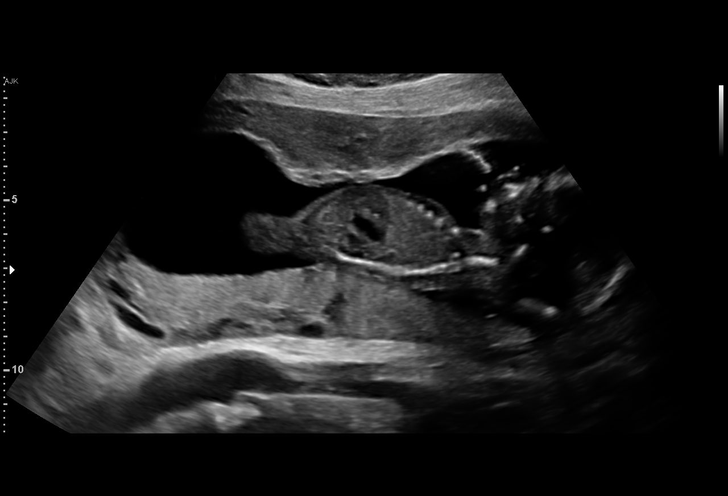
[im 52/71]
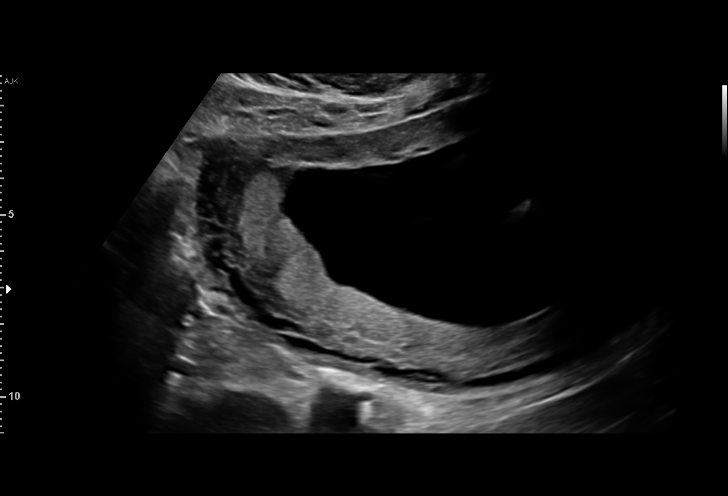
[im 58/71]
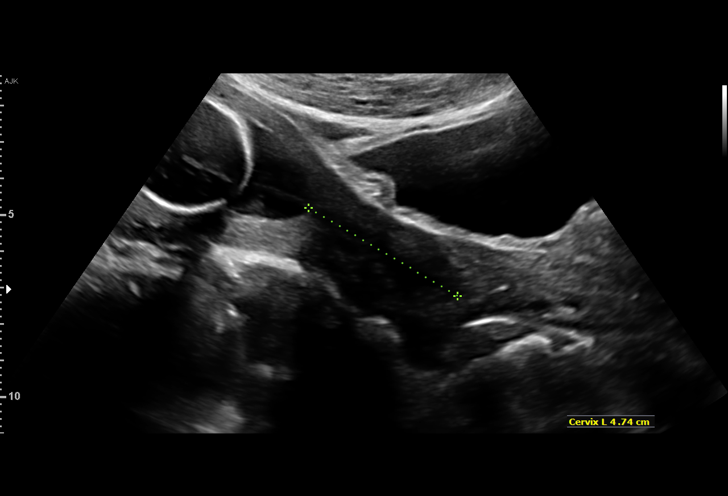
[im 63/71]
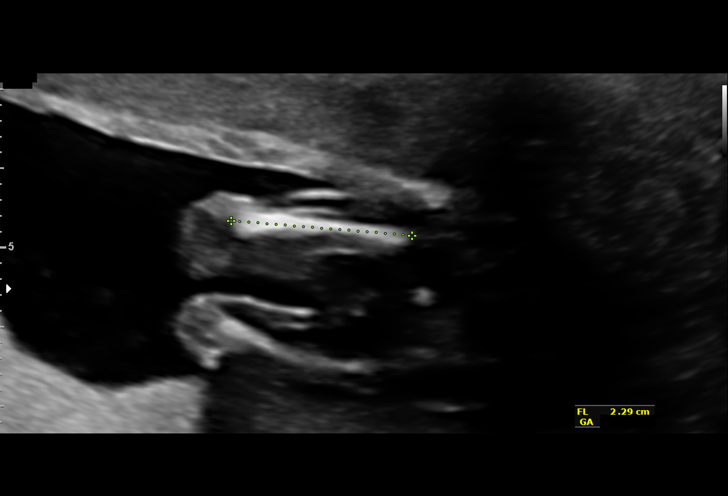
[im 68/71]
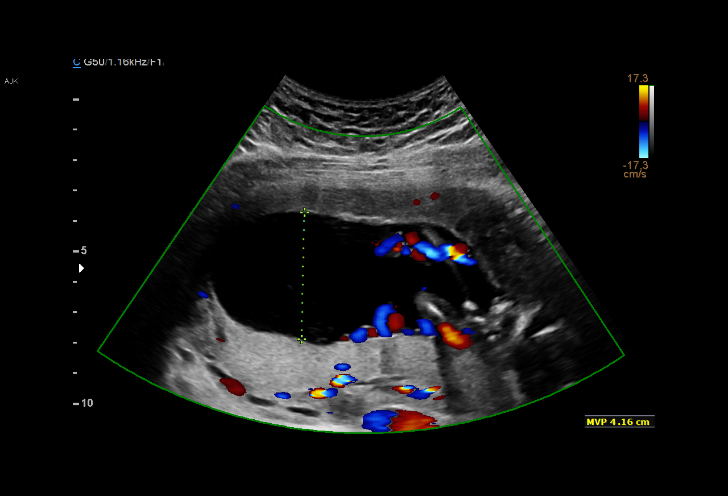

[13 of 28 positions shown; findings below may reference images not displayed]

[facility]

1  MASADA               [id number]      [id number]     [id number]
[place]
Indications

16 weeks gestation of pregnancy
Encounter for fetal anatomic survey
Advanced maternal age multigravida 35+,
second trimester
Abnormal biochemical screen ( [DATE] trisomy
13 and 18 on panorama)
Uterine fibroids affecting pregnancy in        O34.12,
second trimester, antepartum
OB History

Gravidity:    5         Term:   0         SAB:   1
TOP:          2       Ectopic:  1        Living: 0
Fetal Evaluation

Num Of Fetuses:     1
Fetal Heart         153
Rate(bpm):
Cardiac Activity:   Observed
Presentation:       Cephalic
Placenta:           Posterior, above cervical os
P. Cord Insertion:  Visualized, central

Amniotic Fluid
AFI FV:      Subjectively within normal limits

Largest Pocket(cm)
4.16
Biometry

BPD:      35.5  mm     G. Age:  16w 6d         58  %    CI:        74.55   %   70 - 86
FL/HC:      17.8   %   14.6 -
HC:      130.5  mm     G. Age:  16w 5d         37  %    HC/AC:      1.22       1.07 -
AC:      106.9  mm     G. Age:  16w 4d         46  %    FL/BPD:     65.4   %
FL:       23.2  mm     G. Age:  17w 0d         55  %    FL/AC:      21.7   %   20 - 24

Est. FW:     169  gm      0 lb 6 oz     60  %
Gestational Age

LMP:           16w 5d       Date:   08/01/16                 EDD:   05/08/17
U/S Today:     16w 6d                                        EDD:   05/07/17
Best:          16w 5d    Det. By:   LMP  (08/01/16)          EDD:   05/08/17
Anatomy

Cranium:               Appears normal         Aortic Arch:            Appears normal
Cavum:                 Appears normal         Ductal Arch:            Appears normal
Ventricles:            Appears normal         Diaphragm:              Appears normal
Choroid Plexus:        Appears normal         Stomach:                Appears normal, left
sided
Cerebellum:            Appears normal         Abdomen:                Appears normal
Posterior Fossa:       Not well visualized    Abdominal Wall:         Appears nml (cord
insert, abd wall)
Nuchal Fold:           Not well visualized    Cord Vessels:           Appears normal (3
vessel cord)
Face:                  Appears normal         Kidneys:                Appear normal
(orbits and profile)
Lips:                  Not well visualized    Bladder:                Appears normal
Thoracic:              Appears normal         Spine:                  Not well visualized
Heart:                 Not well visualized    Upper Extremities:      Appears normal
RVOT:                  Not well visualized    Lower Extremities:      Appears normal
LVOT:                  Appears normal

Other:  Parents do not wish to know sex of fetus. Fetus appears to be a
female. Nasal bone visualized. Technically difficult due to early
gestational age.
Cervix Uterus Adnexa

Cervix
Length:           4.74  cm.
Normal appearance by transabdominal scan.

Uterus
No abnormality visualized.

Left Ovary
Within normal limits.

Right Ovary
Within normal limits.

Adnexa:       No abnormality visualized.
Comments

Ms. Harshil was referred due to cell-free DNA testing high
risk for Trisomy 13, Trisomy 18, and Triploidy.  There are no
markers of aneuploidy seen on today's ultrasound, although
the heart has not been completely visualized.  However,
given the normal ultrasound I feel the overall risk of
aneuploidy is low, but of course cannot be ruled out with
ultrasound alone.  The patient also had genetics counseling
today.  For full details of that visit please see accompanying
documentation.  The patient decided to forgo genetic testing.
Impression

Single living intrauterine pregnancy at 16 weeks 5 days.
Appropriate fetal growth (60%).
Normal amniotic fluid volume.
The fetal anatomic survey is not complete.
No gross fetal anomalies identified.
Recommendations

Recommend follow-up ultrasound examination in 4 weeks to
reassess fetal growth and anatomy.

## 2019-03-06 ENCOUNTER — Other Ambulatory Visit: Payer: Self-pay

## 2019-03-06 ENCOUNTER — Ambulatory Visit (HOSPITAL_COMMUNITY)
Admission: EM | Admit: 2019-03-06 | Discharge: 2019-03-06 | Disposition: A | Discharge status: Home / Self Care | Payer: BC Managed Care – PPO | Attending: Family Medicine | Admitting: Family Medicine

## 2019-03-06 ENCOUNTER — Encounter (HOSPITAL_COMMUNITY): Payer: Self-pay

## 2019-03-06 DIAGNOSIS — Z87891 Personal history of nicotine dependence: Secondary | ICD-10-CM | POA: Diagnosis not present

## 2019-03-06 DIAGNOSIS — Z20828 Contact with and (suspected) exposure to other viral communicable diseases: Secondary | ICD-10-CM | POA: Diagnosis not present

## 2019-03-06 DIAGNOSIS — Z9189 Other specified personal risk factors, not elsewhere classified: Secondary | ICD-10-CM

## 2019-03-06 NOTE — ED Triage Notes (Signed)
Pt. states a parent at her daughter's home daycare tested POSITIVE, they have had NO contact & just want to be sure, want COVID testing.

## 2019-03-06 NOTE — Discharge Instructions (Signed)
If your Covid-19 test is positive, you will receive a phone call from [facility] regarding your results. Negative test results are not called. Both positive and negative results area always visible on MyChart. If you do not have a MyChart account, sign up instructions are in your discharge papers.  

## 2019-03-07 LAB — NOVEL CORONAVIRUS, NAA (HOSP ORDER, SEND-OUT TO REF LAB; TAT 18-24 HRS): SARS-CoV-2, NAA: NOT DETECTED

## 2019-03-08 NOTE — ED Provider Notes (Signed)
[place]   [id number] 03/06/19 Arrival Time: [id number]  ASSESSMENT & PLAN:  1. At increased risk of exposure to COVID-19 virus     COVID-19 testing sent. To self-quarantine until results are available.  Follow-up Information    Janith Lima, MD.   Specialty: Internal Medicine Why: As needed. Contact information: [address] [address] [phone]           Reviewed expectations re: course of current medical issues. Questions answered. Outlined signs and symptoms indicating need for more acute intervention. Patient verbalized understanding. After Visit Summary given.   SUBJECTIVE: History from: patient. Shannon Davenport is a 39 y.o. female who requests COVID-19 testing. Known COVID-19 contact: questions a parent at daughter's daycare. Recent travel: none. Denies: runny nose, congestion, fever, cough, sore throat, difficulty breathing and headache. Normal PO intake without n/v/d.  ROS: As per HPI.   OBJECTIVE:  Vitals:   03/06/19 1110 03/06/19 1115  BP:  (!) 144/75  Pulse:  85  Resp:  18  Temp:  98.9 F (37.2 C)  TempSrc:  Oral  SpO2:  97%  Weight: 88.9 kg     General appearance: alert; no distress Eyes: PERRLA; EOMI; conjunctiva normal HENT: [place]; AT; nasal mucosa normal; oral mucosa normal Neck: supple  Lungs: speaks full sentences without difficulty; unlabored Heart: regular rate and rhythm Abdomen: soft, non-tender Extremities: no edema Skin: warm and dry Neurologic: normal gait Psychological: alert and cooperative; normal mood and affect  Labs:  Labs Reviewed  NOVEL CORONAVIRUS, NAA (HOSP ORDER, SEND-OUT TO REF LAB; TAT 18-24 HRS)     No Known Allergies  Past Medical History:  Diagnosis Date  . Ectopic pregnancy without intrauterine pregnancy 12/13/2014  . Ectopic pregnancy without intrauterine pregnancy/Laprascopic RT salpingectomy (9/15) 12/13/2014  . GERD (gastroesophageal reflux disease)   .  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 Marital status: Married    Spouse name: Not on file  . Number of children: Not on file  . Years of education: Not on file  . Highest education level: Not on file  Occupational History  . Not on file  Social Needs  . Financial resource strain: Not on file  . Food insecurity    Worry: Not on file    Inability: Not on file  . Transportation needs    Medical: Not on file    Non-medical: Not on file  Tobacco Use  . Smoking status: Former Smoker    Packs/day: 0.25    Years: 7.00    Pack years: 1.75    Types: Cigarettes  . Smokeless tobacco: Never Used  . Tobacco comment: 07/28/13 "3 puffs / MONTH"  Substance and Sexual Activity  . Alcohol use: No    Alcohol/week: 5.0 standard drinks    Types: 5 Glasses of wine per week  . Drug use: No  . Sexual activity: Yes    Birth control/protection: None  Lifestyle  . Physical activity    Days per week: Not on file    Minutes per session: Not on file  . Stress: Not on file  Relationships  . Social Herbalist on phone: Not on file    Gets together: Not on file    Attends religious service: Not on file    Active member of club or organization: Not on file    Attends meetings of clubs or organizations: Not on file  Relationship status: Not on file  . Intimate partner violence    Fear of current or ex partner: Not on file    Emotionally abused: Not on file    Physically abused: Not on file    Forced sexual activity: Not on file  Other Topics Concern  . Not on file  Social History Narrative  . Not on file   Family History  Problem Relation Age of Onset  . Heart disease Mother   . Hyperlipidemia Mother   . Hypertension Mother   . Heart block Mother   . Kidney cancer Father   . Heart block Father    Past Surgical History:  Procedure Laterality Date  . bunioectomy Right 2008  . CESAREAN SECTION N/A 03/13/2017   Procedure: Primary  CESAREAN SECTION;  Surgeon: Brien Few, MD;  Location: [facility];  Service: Obstetrics;  Laterality: N/A;  EDD: 05/08/17  . LAPAROSCOPY N/A 12/13/2014   Procedure: LAPAROSCOPY OPERATIVE;  Surgeon: Brien Few, MD;  Location: [facility];  Service: Gynecology;  Laterality: N/A;  . MYOMECTOMY N/A 03/07/2015   Procedure: Exploratory Laparotomy MYOMECTOMY;  Surgeon: Servando Salina, MD;  Location: [facility];  Service: Gynecology;  Laterality: N/A;  90 min.  Marland Kitchen UNILATERAL SALPINGECTOMY Right 12/13/2014   Procedure: UNILATERAL SALPINGECTOMY;  Surgeon: Brien Few, MD;  Location: [facility];  Service: Gynecology;  Laterality: Right;     Vanessa Kick, MD 03/08/19 [phone]

## 2019-04-01 ENCOUNTER — Encounter (HOSPITAL_COMMUNITY): Payer: Self-pay

## 2019-04-01 ENCOUNTER — Other Ambulatory Visit: Payer: Self-pay

## 2019-04-01 ENCOUNTER — Ambulatory Visit (HOSPITAL_COMMUNITY)
Admission: EM | Admit: 2019-04-01 | Discharge: 2019-04-01 | Disposition: A | Discharge status: Home / Self Care | Payer: BC Managed Care – PPO | Attending: Urgent Care | Admitting: Urgent Care

## 2019-04-01 DIAGNOSIS — Z20822 Contact with and (suspected) exposure to covid-19: Secondary | ICD-10-CM

## 2019-04-01 DIAGNOSIS — Z1152 Encounter for screening for COVID-19: Secondary | ICD-10-CM | POA: Diagnosis not present

## 2019-04-01 NOTE — ED Triage Notes (Signed)
Pt here for covid testing as recommended by her child's babysitter. Pt did also find out she is newly pregnant, denies symptoms of covid or exposures.

## 2019-04-01 NOTE — ED Provider Notes (Signed)
[facility]   MRN: [id number] DOB: [birth year]  Subjective:   Shannon Davenport is a 39 y.o. female presenting for COVID-19 testing.  Denies any direct exposures but would like to make sure given that her daughter has a babysitter that is high risk.  No current facility-administered medications for this encounter.  Current Outpatient Medications:  .  acetaminophen (TYLENOL) 500 MG tablet, Take 500 mg by mouth every 6 (six) hours as needed for moderate pain., Disp: , Rfl:  .  dextromethorphan (DELSYM) 30 MG/5ML liquid, Take 30 mg by mouth as needed for cough., Disp: , Rfl:  .  guaiFENesin (MUCINEX) 600 MG 12 hr tablet, Take 600 mg by mouth 2 (two) times daily as needed for cough., Disp: , Rfl:  .  hydrochlorothiazide (MICROZIDE) 12.5 MG capsule, Take 1 capsule (12.5 mg total) by mouth daily., Disp: 10 capsule, Rfl: 0 .  ibuprofen (ADVIL,MOTRIN) 600 MG tablet, Take 1 tablet (600 mg total) by mouth every 6 (six) hours., Disp: 30 tablet, Rfl: 0 .  labetalol (NORMODYNE) 100 MG tablet, Take 1 tablet (100 mg total) by mouth every 8 (eight) hours., Disp: 90 tablet, Rfl: 0 .  NIFEdipine (PROCARDIA-XL/ADALAT CC) 30 MG 24 hr tablet, Take 1 tablet (30 mg total) by mouth daily., Disp: 30 tablet, Rfl: 0 .  oxyCODONE-acetaminophen (PERCOCET/ROXICET) 5-325 MG tablet, Take 1 tablet by mouth every 4 (four) hours as needed (pain scale 4-7)., Disp: 15 tablet, Rfl: 0 .  Prenatal Vit-Fe Fumarate-FA (PRENATAL MULTIVITAMIN) TABS tablet, Take 1 tablet by mouth daily at 12 noon., Disp: 90 tablet, Rfl: 3   No Known Allergies  Past Medical History:  Diagnosis Date  . Ectopic pregnancy without intrauterine pregnancy 12/13/2014  . Ectopic pregnancy without intrauterine pregnancy/Laprascopic RT salpingectomy (9/15) 12/13/2014  . GERD (gastroesophageal reflux disease)   . 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 bunioectomy Right 2008  . CESAREAN  SECTION N/A 03/13/2017   Procedure: Primary CESAREAN SECTION;  Surgeon: Brien Few, MD;  Location: [facility];  Service: Obstetrics;  Laterality: N/A;  EDD: 05/08/17  . LAPAROSCOPY N/A 12/13/2014   Procedure: LAPAROSCOPY OPERATIVE;  Surgeon: Brien Few, MD;  Location: [facility];  Service: Gynecology;  Laterality: N/A;  . MYOMECTOMY N/A 03/07/2015   Procedure: Exploratory Laparotomy MYOMECTOMY;  Surgeon: Servando Salina, MD;  Location: [facility];  Service: Gynecology;  Laterality: N/A;  90 min.  Marland Kitchen UNILATERAL SALPINGECTOMY Right 12/13/2014   Procedure: UNILATERAL SALPINGECTOMY;  Surgeon: Brien Few, MD;  Location: [facility];  Service: Gynecology;  Laterality: Right;    Family History  Problem Relation Age of Onset  . Heart disease Mother   . Hyperlipidemia Mother   . Hypertension Mother   . Heart block Mother   . Kidney cancer Father   . Heart block Father     Social History   Tobacco Use  . Smoking status: Former Smoker    Packs/day: 0.25    Years: 7.00    Pack years: 1.75    Types: Cigarettes  . Smokeless tobacco: Never Used  . Tobacco comment: 07/28/13 "3 puffs / MONTH"  Substance Use Topics  . Alcohol use: No    Alcohol/week: 5.0 standard drinks    Types: 5 Glasses of wine per week  . Drug use: No    Review of Systems  Constitutional: Negative for fever and malaise/fatigue.  HENT: Negative for congestion, ear pain, sinus pain and sore throat.   Eyes:  Negative for blurred vision, double vision, discharge and redness.  Respiratory: Negative for cough, hemoptysis, shortness of breath and wheezing.   Cardiovascular: Negative for chest pain.  Gastrointestinal: Negative for abdominal pain, diarrhea, nausea and vomiting.  Genitourinary: Negative for dysuria, flank pain and hematuria.  Musculoskeletal: Negative for myalgias.  Skin: Negative for rash.  Neurological: Negative for dizziness, weakness and headaches.  Psychiatric/Behavioral: Negative for depression and  substance abuse.     Objective:   Vitals: BP 125/78 (BP Location: Left Arm)   Pulse 85   Temp 98.2 F (36.8 C) (Oral)   Resp 17   LMP 02/13/2019   SpO2 99%   Physical Exam Constitutional:      General: She is not in acute distress.    Appearance: Normal appearance. She is well-developed. She is not ill-appearing, toxic-appearing or diaphoretic.  HENT:     Head: Normocephalic and atraumatic.     Nose: Nose normal.     Mouth/Throat:     Mouth: Mucous membranes are moist.     Pharynx: Oropharynx is clear.  Eyes:     General: No scleral icterus.    Extraocular Movements: Extraocular movements intact.     Pupils: Pupils are equal, round, and reactive to light.  Cardiovascular:     Rate and Rhythm: Normal rate.  Pulmonary:     Effort: Pulmonary effort is normal.  Skin:    General: Skin is warm and dry.  Neurological:     General: No focal deficit present.     Mental Status: She is alert and oriented to person, place, and time.  Psychiatric:        Mood and Affect: Mood normal.        Behavior: Behavior normal.     Assessment and Plan :   1. Exposure to COVID-19 virus     Will manage for viral illness such as viral URI, viral rhinitis, possible COVID-19. Counseled patient on nature of COVID-19 including modes of transmission, diagnostic testing, management and supportive care.  Offered symptomatic relief. COVID 19 testing is pending. Counseled patient on potential for adverse effects with medications prescribed/recommended today, ER and return-to-clinic precautions discussed, patient verbalized understanding.     Jaynee Eagles, [place] 04/01/19 1616

## 2019-04-01 NOTE — Discharge Instructions (Signed)
We will manage this as a viral syndrome. For sore throat or cough try using a honey-based tea. Use 3 teaspoons of honey with juice squeezed from half lemon. Place shaved pieces of ginger into 1/2-1 cup of water and warm over stove top. Then mix the ingredients and repeat every 4 hours as needed. Please take Tylenol 500mg  every 6 hours. Hydrate very well with at least 2 liters of water. Eat light meals such as soups to replenish electrolytes and soft fruits, veggies. Start an antihistamine like Zyrtec (cetirizine) at 10mg  daily for postnasal drainage, sinus congestion.  You can take this together with pseudoephedrine (Sudafed) at a dose of 60 mg 3 times a day or twice daily as needed for the same kind of congestion.

## 2019-04-02 LAB — NOVEL CORONAVIRUS, NAA (HOSP ORDER, SEND-OUT TO REF LAB; TAT 18-24 HRS): SARS-CoV-2, NAA: NOT DETECTED

## 2019-04-27 LAB — OB RESULTS CONSOLE RUBELLA ANTIBODY, IGM: Rubella: IMMUNE

## 2019-04-27 LAB — OB RESULTS CONSOLE GC/CHLAMYDIA
Chlamydia: NEGATIVE
Gonorrhea: NEGATIVE

## 2019-04-27 LAB — OB RESULTS CONSOLE ANTIBODY SCREEN: Antibody Screen: NEGATIVE

## 2019-04-27 LAB — OB RESULTS CONSOLE HIV ANTIBODY (ROUTINE TESTING): HIV: NONREACTIVE

## 2019-04-27 LAB — OB RESULTS CONSOLE ABO/RH: RH Type: POSITIVE

## 2019-04-27 LAB — OB RESULTS CONSOLE HEPATITIS B SURFACE ANTIGEN: Hepatitis B Surface Ag: NEGATIVE

## 2019-04-27 LAB — OB RESULTS CONSOLE RPR: RPR: NONREACTIVE

## 2019-05-03 ENCOUNTER — Encounter (HOSPITAL_COMMUNITY): Payer: Self-pay

## 2019-05-11 ENCOUNTER — Other Ambulatory Visit: Payer: Self-pay | Admitting: Obstetrics

## 2019-06-12 ENCOUNTER — Other Ambulatory Visit (HOSPITAL_COMMUNITY): Payer: Self-pay | Admitting: Obstetrics and Gynecology

## 2019-06-12 ENCOUNTER — Encounter (HOSPITAL_COMMUNITY): Payer: Self-pay

## 2019-06-12 DIAGNOSIS — Z363 Encounter for antenatal screening for malformations: Secondary | ICD-10-CM

## 2019-06-15 ENCOUNTER — Ambulatory Visit (HOSPITAL_COMMUNITY)
Admission: RE | Admit: 2019-06-15 | Discharge: 2019-06-15 | Disposition: A | Discharge status: Home / Self Care | Payer: BC Managed Care – PPO | Source: Ambulatory Visit | Attending: Obstetrics and Gynecology | Admitting: Obstetrics and Gynecology

## 2019-06-15 ENCOUNTER — Other Ambulatory Visit (HOSPITAL_COMMUNITY): Payer: Self-pay | Admitting: *Deleted

## 2019-06-15 ENCOUNTER — Ambulatory Visit (HOSPITAL_COMMUNITY): Payer: Self-pay | Admitting: Genetic Counselor

## 2019-06-15 ENCOUNTER — Other Ambulatory Visit: Payer: Self-pay

## 2019-06-15 ENCOUNTER — Ambulatory Visit (HOSPITAL_COMMUNITY): Payer: BC Managed Care – PPO

## 2019-06-15 ENCOUNTER — Encounter (HOSPITAL_COMMUNITY): Payer: Self-pay

## 2019-06-15 ENCOUNTER — Ambulatory Visit (HOSPITAL_COMMUNITY): Payer: BC Managed Care – PPO | Admitting: *Deleted

## 2019-06-15 ENCOUNTER — Other Ambulatory Visit: Payer: Self-pay | Admitting: Obstetrics

## 2019-06-15 ENCOUNTER — Ambulatory Visit (HOSPITAL_BASED_OUTPATIENT_CLINIC_OR_DEPARTMENT_OTHER): Payer: BC Managed Care – PPO | Admitting: Genetic Counselor

## 2019-06-15 VITALS — BP 153/67 | HR 98 | Temp 98.4°F | Wt 210.2 lb

## 2019-06-15 DIAGNOSIS — O43102 Malformation of placenta, unspecified, second trimester: Secondary | ICD-10-CM

## 2019-06-15 DIAGNOSIS — Z363 Encounter for antenatal screening for malformations: Secondary | ICD-10-CM

## 2019-06-15 DIAGNOSIS — O09522 Supervision of elderly multigravida, second trimester: Secondary | ICD-10-CM | POA: Insufficient documentation

## 2019-06-15 DIAGNOSIS — O99212 Obesity complicating pregnancy, second trimester: Secondary | ICD-10-CM | POA: Diagnosis not present

## 2019-06-15 DIAGNOSIS — Z148 Genetic carrier of other disease: Secondary | ICD-10-CM | POA: Diagnosis not present

## 2019-06-15 DIAGNOSIS — Z362 Encounter for other antenatal screening follow-up: Secondary | ICD-10-CM

## 2019-06-15 DIAGNOSIS — Z3A17 17 weeks gestation of pregnancy: Secondary | ICD-10-CM | POA: Diagnosis not present

## 2019-06-15 DIAGNOSIS — O285 Abnormal chromosomal and genetic finding on antenatal screening of mother: Secondary | ICD-10-CM

## 2019-06-15 DIAGNOSIS — O34219 Maternal care for unspecified type scar from previous cesarean delivery: Secondary | ICD-10-CM

## 2019-06-15 DIAGNOSIS — O3412 Maternal care for benign tumor of corpus uteri, second trimester: Secondary | ICD-10-CM

## 2019-06-15 NOTE — Progress Notes (Signed)
06/15/2019  DONNETT EIMERS [birth year] MRN: [id number] DOV: 06/15/2019  Ms. Pelczar presented to the [facility] for a genetics consultation regarding abnormal noninvasive prenatal screening (NIPS) resultsdemonstrating an increased risk for triploidy, trisomy 48, or trisomy 33 in the pregnancy. Ms. Gaschler came to her appointment alone due to COVID-19 visitor restrictions.   Indication for genetic counseling - NIPS high riskfor triploidy, trisomy 56, or trisomy 18due toinsufficientfetal DNA fraction  Prenatal history  Ms. Carothers is a [id number], 39 y.o. female. Her current pregnancy has completed [redacted]w[redacted]d (Estimated Date of Delivery: 11/20/19).  Ms. Sensabaugh denied exposure to environmental toxins or chemical agents. She denied the use of alcohol, tobacco or street drugs. She reported taking prenatal vitamins and low-dose aspirin. She denied significant viral illnesses, fevers, and bleeding during the course of her pregnancy. She has a history of two ectopic pregnancies and a history of preeclampsia and preterm delivery during her pregnancy with her daughter. Her medical and surgical histories were otherwise noncontributory.  Family History  A three generation pedigree was drafted and reviewed. The family history is remarkable for the following:  - Ms. Visone reported a possible history of speech delay in her brother. However, she was not sure whether he was truly diagnosed with speech delays or if he chose not to talk much during early childhood. His speech development eventually caught up to that of his peers.  - Ms. Barua's partner, Donatella Bui, has a sister who was diagnosed with cancer in her early 47s. Ms. Boxley was not sure of the specific type of cancer this relative had. She believes that Mr. Bolda's sister had genetic testing for an inherited cancer syndrome; however, Ms. Grimes was unsure of the results. For that reason,  precise risk assessment is limited.  The remaining family histories were reviewed and found to be noncontributory for birth defects, intellectual disability, recurrent pregnancy loss, and known genetic conditions. Ms. Mumpower had limited information about her partner's extended family history; thus, risk assessment was limited.  The patient's ethnicity is African American. The father of the pregnancy's ethnicity is African American. Ashkenazi Jewish ancestry and consanguinity were denied. Pedigree will be scanned under Media.  Discussion  Ms. Thrall had Panorama noninvasive prenatal screening (NIPS) through [facility] that was high risk due to insufficient fetal DNA fraction. Ms. Gregor had two blood draws performed for Panorama NIPS. She received no result for her first sample due to insufficient fetal DNA, with a fetal fraction of 3.7% at [redacted]w[redacted]d. Testing was not able to be completed on her second sample as it was delayed in reaching the laboratory due to severe weather events. Given the insufficient fetal fraction from her first sample, an additional analysis was performed by [facility] that identified the pregnancy as being at high risk (1 in 17, or ~6%) for triploidy, trisomy 72, and trisomy 81.      NIPS analyzes cell free DNA originating from the placenta that is found in the maternal blood circulation during pregnancy. This test is not diagnostic for chromosome conditions, but can provide information regarding the presence or absence of extra fetal DNA for chromosomes 13, 18, 21, and the sex chromosomes. Panorama NIPS also estimates the risk for triploidy in a pregnancy. Panorama NIPS utilizes single nucleotide polymorphisms (SNPs) to distinguish maternal from fetal (placental) DNA. The term "fetal fraction" refers to the amount of sample that is believed to have come from fetal DNA rather than maternal DNA. We reviewed that there are many possible  reasons a sample may have a low fetal fraction,  including early gestational age, high maternal BMI, suboptimal sample collection, maternal use of medications like low molecular weight heparin, pregnancy loss, pregnancy complications, and normal variation. Pregnancies affected by triploidy, trisomy 35, and trisomy 73 have all been associated with low fetal fraction on NIPS as well; however, there is no increased incidence of trisomy 34 or monosomy X in cases with low fetal fraction. It is thought that pregnancies with triploidy, trisomy 47, and trisomy 18 may have smaller placentas, which could result in an insufficient fetal fraction. We briefly reviewed that triploidy, trisomy 32, and trisomy 62 are all lethal conditions that impact various organ systems throughout the body. Fetuses with triploidy, trisomy 66, and trisomy 11 often pass away during pregnancy; affected infants often pass away very shortly after birth, and most do not survive beyond 39 year of age.  A complete ultrasound was performed today prior to our visit. The ultrasound report will be sent under separate cover. There were no visualized fetal anomalies or markers suggestive of aneuploidy. Ms. Zelmer was counseled that 90-95% of fetuses with trisomy 27, trisomy 104, or triploidy will have a related anomaly or marker visualized on this ultrasound. The fact that no ultrasound anomalies were identified today is encouraging; however, 10% of fetuses with trisomy 57, trisomy 60, or triploidy may not demonstrate any signs of the condition on ultrasound.  We discussed additional available screening/testing options. Firstly,redrawing a new sample for NIPS is possible. If the redrawwas successful and contained a sufficient fetal fraction, results could clarify the risks for chromosomal aneuploidy in the current pregnancy.Ms.Alexanderwas counseled that we could pursue NIPS through a different laboratory that is able to analyze samples with lower fetal fraction. However, there is still a chance  that a new sample may not contain a sufficient fetal fraction. Additionally, other NIPS laboratories are not able to assess for triploidy; only [facility] NIPS is able to provide a risk for triploidy in a pregnancy.  Secondly, Ms. Galant was counseled that quad screening is another form of aneuploidy screening that is available for her at this point in pregnancy. Instead of utilizing fetal DNA, quad screening measures four proteins in a pregnant woman's blood. The levels of these proteins can help identify if a pregnancy is at high risk for trisomy 71 (Down syndrome), trisomy 18, and open neural tube defects (ONTDs). Quad screening detects 75-80% of cases of Down syndrome, 73% of cases of trisomy 18, and 80-90% of ONTDs. It cannot assess for the risk of trisomy 75 or triploidy in a pregnancy.   Finally, Ms. Branton wascounseled regarding diagnostic testing via amniocentesis. We discussed the technical aspects of the procedure and quoted up to a 1 in 500 (0.2%) risk for spontaneous pregnancy loss or other adverse pregnancy outcomes as a result of amniocentesis. Cultured cells from an amniocentesis sample allow for the visualization of a fetal karyotype, which can detect >99% of chromosomal aberrations. Chromosomal microarray can also be performed to identify smaller deletions or duplications of fetal chromosomal material. Ms. Furer was informed that amniocentesis is the only testing option that can determine a fetuses's chromosomal aneuploidy status with certainty prenatally.   After careful consideration of her options, Ms. Wellen declined a redraw for NIPS, quad screening, and amniocentesis at this time. She understands that NIPS and amniocentesis are available options throughout the end of pregnancy and that she may opt to undergo testing at a later date should she change her mind.  Per  ACOG recommendation, carrier screening for hemoglobinopathies, cystic fibrosis (CF) and spinal muscular atrophy  (SMA) was also discussed including information about the conditions, rationale for testing, autosomal recessive inheritance, and the option of prenatal diagnosis. Ms. Ammann had a normal hemoglobin electrophoresis, ruling out carrier status for clinically significant hemoglobinopathies. Ms. 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 However, this report was not available for me to review. SMA is a condition caused by mutations in the SMN1 gene. Typically, individuals have two copies of the SMN1 gene, with one on each copy of chromosome 5. SMA carriers only have one copy of the SMN1 gene present on one chromosome, with no copies of SMN1 present on the other chromosome. SMA is characterized by progressive muscle weakness and atrophy due to degeneration and loss of anterior horn cells (lower motor neurons) in the spinal cord and brain stem. We discussed the different types of SMA (0, I, II, and III), including differences in severity and age of onset. We also reviewed the autosomal recessive inheritance pattern of SMA. We discussed that the couple only has a chance of having a child with SMA if both parents are identified as carriers for the condition. Based on the carrier frequency for SMA in the African American population, Ms. Smalling's husband has a 1 in 33 chance of being a carrier of SMA. Given Ms. Carrington's reported carrier status for SMA, the couple currently has a 1 in 264 (0.4%) chance of having a child with SMA with each pregnancy based on her husband's ethnicity. The chances of the couple having a child with SMA would increase to 1 in 4 (25%) if Ms. Salyers's husband were also identified as a carrier. We discussed that carrier screening for SMA is recommended for Ms. Shira's husband to refine their risk of having an affected child; however, Ms. Seckman has already discussed this option with  her husband and he is not interested in pursuing testing.   Additional screening and diagnostic testing were declined today. Ms. Forestier had received the same type of NIPS result in her previous pregnancy and subsequently had a healthy daughter. She was reassured by today's normal appearing ultrasound and stated that continuing to pursue further testing would worry her too much throughout the remainder of her pregnancy. She also indicated that she would continue the pregnancy even if a diagnosis of fetal trisomy 22, trisomy 20, or triploidy were to be confirmed, so she was comfortable not pursuing additional prenatal testing. She also understands that screening tests, including ultrasound, cannot rule out all birth defects or genetic syndromes.   I counseled Ms. Worlds regarding the above risks and available options. The approximate face-to-face time with the genetic counselor was 35 minutes.  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 Husband declined partner carrier screening  Reviewed results of ultrasound  No fetal anomalies or markers seen  Reduction in risk for fetal aneuploidy  Offered additional testing and screening  Declined amniocentesis  Reviewed family history concerns   Buelah Manis, MS, Counselling psychologist

## 2019-07-14 ENCOUNTER — Ambulatory Visit (HOSPITAL_COMMUNITY): Payer: BC Managed Care – PPO | Admitting: *Deleted

## 2019-07-14 ENCOUNTER — Ambulatory Visit (HOSPITAL_COMMUNITY)
Admission: RE | Admit: 2019-07-14 | Discharge: 2019-07-14 | Disposition: A | Discharge status: Home / Self Care | Payer: BC Managed Care – PPO | Source: Ambulatory Visit | Attending: Obstetrics and Gynecology | Admitting: Obstetrics and Gynecology

## 2019-07-14 ENCOUNTER — Other Ambulatory Visit (HOSPITAL_COMMUNITY): Payer: Self-pay | Admitting: *Deleted

## 2019-07-14 ENCOUNTER — Other Ambulatory Visit: Payer: Self-pay

## 2019-07-14 ENCOUNTER — Encounter (HOSPITAL_COMMUNITY): Payer: Self-pay

## 2019-07-14 VITALS — BP 150/74 | HR 99 | Temp 98.2°F

## 2019-07-14 DIAGNOSIS — O0990 Supervision of high risk pregnancy, unspecified, unspecified trimester: Secondary | ICD-10-CM

## 2019-07-14 DIAGNOSIS — O285 Abnormal chromosomal and genetic finding on antenatal screening of mother: Secondary | ICD-10-CM | POA: Diagnosis not present

## 2019-07-14 DIAGNOSIS — Z362 Encounter for other antenatal screening follow-up: Secondary | ICD-10-CM

## 2019-07-14 DIAGNOSIS — Z3A21 21 weeks gestation of pregnancy: Secondary | ICD-10-CM | POA: Diagnosis not present

## 2019-08-11 ENCOUNTER — Ambulatory Visit (HOSPITAL_COMMUNITY): Payer: BC Managed Care – PPO | Attending: Obstetrics and Gynecology

## 2019-08-11 ENCOUNTER — Ambulatory Visit: Payer: BC Managed Care – PPO | Admitting: *Deleted

## 2019-08-11 ENCOUNTER — Other Ambulatory Visit: Payer: Self-pay

## 2019-08-11 ENCOUNTER — Other Ambulatory Visit: Payer: Self-pay | Admitting: *Deleted

## 2019-08-11 VITALS — BP 144/70 | HR 92 | Temp 97.1°F

## 2019-08-11 DIAGNOSIS — Z362 Encounter for other antenatal screening follow-up: Secondary | ICD-10-CM | POA: Insufficient documentation

## 2019-08-11 DIAGNOSIS — O09292 Supervision of pregnancy with other poor reproductive or obstetric history, second trimester: Secondary | ICD-10-CM | POA: Diagnosis not present

## 2019-08-11 DIAGNOSIS — O09529 Supervision of elderly multigravida, unspecified trimester: Secondary | ICD-10-CM

## 2019-08-11 DIAGNOSIS — O09522 Supervision of elderly multigravida, second trimester: Secondary | ICD-10-CM | POA: Diagnosis not present

## 2019-08-11 DIAGNOSIS — O285 Abnormal chromosomal and genetic finding on antenatal screening of mother: Secondary | ICD-10-CM

## 2019-08-11 DIAGNOSIS — O10919 Unspecified pre-existing hypertension complicating pregnancy, unspecified trimester: Secondary | ICD-10-CM

## 2019-08-11 DIAGNOSIS — O34219 Maternal care for unspecified type scar from previous cesarean delivery: Secondary | ICD-10-CM

## 2019-08-11 DIAGNOSIS — O3429 Maternal care due to uterine scar from other previous surgery: Secondary | ICD-10-CM | POA: Diagnosis not present

## 2019-08-11 DIAGNOSIS — Z3A25 25 weeks gestation of pregnancy: Secondary | ICD-10-CM

## 2019-08-11 DIAGNOSIS — E669 Obesity, unspecified: Secondary | ICD-10-CM

## 2019-08-11 DIAGNOSIS — O99212 Obesity complicating pregnancy, second trimester: Secondary | ICD-10-CM

## 2019-09-15 ENCOUNTER — Ambulatory Visit: Payer: BC Managed Care – PPO | Attending: Obstetrics and Gynecology

## 2019-09-15 ENCOUNTER — Other Ambulatory Visit: Payer: Self-pay | Admitting: *Deleted

## 2019-09-15 ENCOUNTER — Ambulatory Visit: Payer: BC Managed Care – PPO | Admitting: *Deleted

## 2019-09-15 ENCOUNTER — Other Ambulatory Visit: Payer: Self-pay

## 2019-09-15 VITALS — BP 146/76 | HR 103

## 2019-09-15 DIAGNOSIS — O34219 Maternal care for unspecified type scar from previous cesarean delivery: Secondary | ICD-10-CM

## 2019-09-15 DIAGNOSIS — O99213 Obesity complicating pregnancy, third trimester: Secondary | ICD-10-CM

## 2019-09-15 DIAGNOSIS — O285 Abnormal chromosomal and genetic finding on antenatal screening of mother: Secondary | ICD-10-CM

## 2019-09-15 DIAGNOSIS — Z362 Encounter for other antenatal screening follow-up: Secondary | ICD-10-CM | POA: Diagnosis not present

## 2019-09-15 DIAGNOSIS — O10919 Unspecified pre-existing hypertension complicating pregnancy, unspecified trimester: Secondary | ICD-10-CM

## 2019-09-15 DIAGNOSIS — O09523 Supervision of elderly multigravida, third trimester: Secondary | ICD-10-CM

## 2019-09-15 DIAGNOSIS — O0990 Supervision of high risk pregnancy, unspecified, unspecified trimester: Secondary | ICD-10-CM | POA: Insufficient documentation

## 2019-09-15 DIAGNOSIS — Z3A30 30 weeks gestation of pregnancy: Secondary | ICD-10-CM

## 2019-09-15 DIAGNOSIS — E669 Obesity, unspecified: Secondary | ICD-10-CM

## 2019-09-15 DIAGNOSIS — O09293 Supervision of pregnancy with other poor reproductive or obstetric history, third trimester: Secondary | ICD-10-CM

## 2019-09-15 DIAGNOSIS — O3429 Maternal care due to uterine scar from other previous surgery: Secondary | ICD-10-CM

## 2019-09-15 DIAGNOSIS — O09529 Supervision of elderly multigravida, unspecified trimester: Secondary | ICD-10-CM | POA: Insufficient documentation

## 2019-10-05 ENCOUNTER — Other Ambulatory Visit: Payer: Self-pay | Admitting: Obstetrics and Gynecology

## 2019-10-13 ENCOUNTER — Ambulatory Visit: Payer: BC Managed Care – PPO | Admitting: *Deleted

## 2019-10-13 ENCOUNTER — Ambulatory Visit: Payer: BC Managed Care – PPO | Attending: Obstetrics

## 2019-10-13 ENCOUNTER — Other Ambulatory Visit: Payer: Self-pay

## 2019-10-13 VITALS — BP 125/86 | HR 97

## 2019-10-13 DIAGNOSIS — O0990 Supervision of high risk pregnancy, unspecified, unspecified trimester: Secondary | ICD-10-CM | POA: Insufficient documentation

## 2019-10-13 DIAGNOSIS — O09293 Supervision of pregnancy with other poor reproductive or obstetric history, third trimester: Secondary | ICD-10-CM | POA: Diagnosis not present

## 2019-10-13 DIAGNOSIS — Z362 Encounter for other antenatal screening follow-up: Secondary | ICD-10-CM | POA: Diagnosis not present

## 2019-10-13 DIAGNOSIS — O10919 Unspecified pre-existing hypertension complicating pregnancy, unspecified trimester: Secondary | ICD-10-CM

## 2019-10-13 DIAGNOSIS — O3413 Maternal care for benign tumor of corpus uteri, third trimester: Secondary | ICD-10-CM

## 2019-10-13 DIAGNOSIS — O34219 Maternal care for unspecified type scar from previous cesarean delivery: Secondary | ICD-10-CM | POA: Diagnosis not present

## 2019-10-13 DIAGNOSIS — O09523 Supervision of elderly multigravida, third trimester: Secondary | ICD-10-CM

## 2019-10-13 DIAGNOSIS — O99213 Obesity complicating pregnancy, third trimester: Secondary | ICD-10-CM

## 2019-10-13 DIAGNOSIS — O285 Abnormal chromosomal and genetic finding on antenatal screening of mother: Secondary | ICD-10-CM

## 2019-10-13 DIAGNOSIS — E669 Obesity, unspecified: Secondary | ICD-10-CM

## 2019-10-13 DIAGNOSIS — O133 Gestational [pregnancy-induced] hypertension without significant proteinuria, third trimester: Secondary | ICD-10-CM

## 2019-10-13 DIAGNOSIS — Z3A34 34 weeks gestation of pregnancy: Secondary | ICD-10-CM

## 2019-10-17 ENCOUNTER — Encounter (HOSPITAL_COMMUNITY): Payer: Self-pay

## 2019-10-17 NOTE — Patient Instructions (Signed)
Shannon Davenport  10/17/2019   Your procedure is scheduled on:  10/30/2019  Arrive at 16 at Entrance C on [address] at [facility]  and [employer] [employer]. You are invited to use the FREE valet parking or use the Visitor's parking deck.  Pick up the phone at the desk and dial [phone].  Call this number if you have problems the morning of surgery: [phone]  Remember:   Do not eat food:(After Midnight) Desps de medianoche.  Do not drink clear liquids: (After Midnight) Desps de medianoche.  Take these medicines the morning of surgery with A SIP OF WATER:  none   Do not wear jewelry, make-up or nail polish.  Do not wear lotions, powders, or perfumes. Do not wear deodorant.  Do not shave 48 hours prior to surgery.  Do not bring valuables to the hospital.  [facility] is not   responsible for any belongings or valuables brought to the hospital.  Contacts, dentures or bridgework may not be worn into surgery.  Leave suitcase in the car. After surgery it may be brought to your room.  For patients admitted to the hospital, checkout time is 11:00 AM the day of              discharge.      Please read over the following fact sheets that you were given:     Preparing for Surgery

## 2019-10-28 ENCOUNTER — Other Ambulatory Visit (HOSPITAL_COMMUNITY)
Admission: RE | Admit: 2019-10-28 | Discharge: 2019-10-28 | Disposition: A | Discharge status: Home / Self Care | Payer: BC Managed Care – PPO | Source: Ambulatory Visit | Attending: Obstetrics and Gynecology | Admitting: Obstetrics and Gynecology

## 2019-10-28 ENCOUNTER — Other Ambulatory Visit: Payer: Self-pay

## 2019-10-28 DIAGNOSIS — Z20822 Contact with and (suspected) exposure to covid-19: Secondary | ICD-10-CM | POA: Insufficient documentation

## 2019-10-28 DIAGNOSIS — Z01812 Encounter for preprocedural laboratory examination: Secondary | ICD-10-CM | POA: Insufficient documentation

## 2019-10-28 HISTORY — DX: Benign neoplasm of connective and other soft tissue, unspecified: D21.9

## 2019-10-28 HISTORY — DX: Gestational (pregnancy-induced) hypertension without significant proteinuria, unspecified trimester: O13.9

## 2019-10-28 HISTORY — DX: Supervision of pregnancy with other poor reproductive or obstetric history, unspecified trimester: O09.299

## 2019-10-28 LAB — CBC
HCT: 34.7 % — ABNORMAL LOW (ref 36.0–46.0)
Hemoglobin: 10.9 g/dL — ABNORMAL LOW (ref 12.0–15.0)
MCH: 28.2 pg (ref 26.0–34.0)
MCHC: 31.4 g/dL (ref 30.0–36.0)
MCV: 89.7 fL (ref 80.0–100.0)
Platelets: 198 10*3/uL (ref 150–400)
RBC: 3.87 MIL/uL (ref 3.87–5.11)
RDW: 14.4 % (ref 11.5–15.5)
WBC: 7.2 10*3/uL (ref 4.0–10.5)
nRBC: 0 % (ref 0.0–0.2)

## 2019-10-28 LAB — TYPE AND SCREEN
ABO/RH(D): O POS
Antibody Screen: NEGATIVE

## 2019-10-28 LAB — RPR: RPR Ser Ql: NONREACTIVE

## 2019-10-28 LAB — SARS CORONAVIRUS 2 (TAT 6-24 HRS): SARS Coronavirus 2: NEGATIVE

## 2019-10-28 NOTE — MAU Note (Signed)
Pt here for labs and covid swab. Denies symptoms or sick contacts. Swab collected.

## 2019-10-30 ENCOUNTER — Inpatient Hospital Stay (HOSPITAL_COMMUNITY): Payer: BC Managed Care – PPO | Admitting: Certified Registered Nurse Anesthetist

## 2019-10-30 ENCOUNTER — Encounter (HOSPITAL_COMMUNITY)
Admission: RE | Disposition: A | Discharge status: Home / Self Care | Payer: Self-pay | Source: Home / Self Care | Attending: Obstetrics and Gynecology

## 2019-10-30 ENCOUNTER — Other Ambulatory Visit: Payer: Self-pay

## 2019-10-30 ENCOUNTER — Inpatient Hospital Stay (HOSPITAL_COMMUNITY)
Admission: RE | Admit: 2019-10-30 | Discharge: 2019-11-01 | DRG: 788 | Disposition: A | Discharge status: Home / Self Care | Payer: BC Managed Care – PPO | Attending: Obstetrics and Gynecology | Admitting: Obstetrics and Gynecology

## 2019-10-30 ENCOUNTER — Encounter (HOSPITAL_COMMUNITY): Payer: Self-pay | Admitting: Obstetrics and Gynecology

## 2019-10-30 DIAGNOSIS — O34211 Maternal care for low transverse scar from previous cesarean delivery: Principal | ICD-10-CM | POA: Diagnosis present

## 2019-10-30 DIAGNOSIS — Z20822 Contact with and (suspected) exposure to covid-19: Secondary | ICD-10-CM | POA: Diagnosis present

## 2019-10-30 DIAGNOSIS — Z3A37 37 weeks gestation of pregnancy: Secondary | ICD-10-CM

## 2019-10-30 DIAGNOSIS — Z9889 Other specified postprocedural states: Secondary | ICD-10-CM

## 2019-10-30 DIAGNOSIS — Z87891 Personal history of nicotine dependence: Secondary | ICD-10-CM

## 2019-10-30 DIAGNOSIS — Z98891 History of uterine scar from previous surgery: Secondary | ICD-10-CM

## 2019-10-30 DIAGNOSIS — O34219 Maternal care for unspecified type scar from previous cesarean delivery: Secondary | ICD-10-CM | POA: Diagnosis present

## 2019-10-30 SURGERY — Surgical Case
Anesthesia: Spinal | Wound class: Clean Contaminated

## 2019-10-30 MED ORDER — METHYLERGONOVINE MALEATE 0.2 MG PO TABS: 0.2000 mg | ORAL_TABLET | ORAL | Status: DC | PRN

## 2019-10-30 MED ORDER — MEPERIDINE HCL 25 MG/ML IJ SOLN: 6.2500 mg | INTRAMUSCULAR | Status: DC | PRN

## 2019-10-30 MED ORDER — FENTANYL CITRATE (PF) 100 MCG/2ML IJ SOLN
INTRAMUSCULAR | Status: AC
  Filled 2019-10-30: qty 2

## 2019-10-30 MED ORDER — METHYLERGONOVINE MALEATE 0.2 MG/ML IJ SOLN: 0.2000 mg | INTRAMUSCULAR | Status: DC | PRN

## 2019-10-30 MED ORDER — MENTHOL 3 MG MT LOZG: 1.0000 | LOZENGE | OROMUCOSAL | Status: DC | PRN

## 2019-10-30 MED ORDER — KETOROLAC TROMETHAMINE 30 MG/ML IJ SOLN
INTRAMUSCULAR | Status: AC
  Filled 2019-10-30: qty 1

## 2019-10-30 MED ORDER — OXYCODONE-ACETAMINOPHEN 5-325 MG PO TABS
1.0000 | ORAL_TABLET | ORAL | Status: DC | PRN
  Administered 2019-11-01: 1 via ORAL
  Filled 2019-10-30: qty 1

## 2019-10-30 MED ORDER — SCOPOLAMINE 1 MG/3DAYS TD PT72
MEDICATED_PATCH | TRANSDERMAL | Status: AC
  Filled 2019-10-30: qty 1

## 2019-10-30 MED ORDER — NALOXONE HCL 4 MG/10ML IJ SOLN
1.0000 ug/kg/h | INTRAVENOUS | Status: DC | PRN
  Filled 2019-10-30: qty 5

## 2019-10-30 MED ORDER — PHENYLEPHRINE HCL-NACL 20-0.9 MG/250ML-% IV SOLN
INTRAVENOUS | Status: AC
  Filled 2019-10-30: qty 250

## 2019-10-30 MED ORDER — COCONUT OIL OIL: 1.0000 "application " | TOPICAL_OIL | Status: DC | PRN

## 2019-10-30 MED ORDER — SIMETHICONE 80 MG PO CHEW
80.0000 mg | CHEWABLE_TABLET | ORAL | Status: DC
  Administered 2019-10-31 – 2019-11-01 (×2): 80 mg via ORAL
  Filled 2019-10-30 (×2): qty 1

## 2019-10-30 MED ORDER — DIPHENHYDRAMINE HCL 25 MG PO CAPS: 25.0000 mg | ORAL_CAPSULE | ORAL | Status: DC | PRN

## 2019-10-30 MED ORDER — OXYCODONE HCL 5 MG/5ML PO SOLN: 5.0000 mg | Freq: Once | ORAL | Status: DC | PRN

## 2019-10-30 MED ORDER — POVIDONE-IODINE 10 % EX SWAB
2.0000 "application " | Freq: Once | CUTANEOUS | Status: AC
  Administered 2019-10-30: 2 via TOPICAL

## 2019-10-30 MED ORDER — DIPHENHYDRAMINE HCL 25 MG PO CAPS: 25.0000 mg | ORAL_CAPSULE | Freq: Four times a day (QID) | ORAL | Status: DC | PRN

## 2019-10-30 MED ORDER — STERILE WATER FOR IRRIGATION IR SOLN
Status: DC | PRN
  Administered 2019-10-30: 1000 mL

## 2019-10-30 MED ORDER — ONDANSETRON HCL 4 MG/2ML IJ SOLN
INTRAMUSCULAR | Status: DC | PRN
  Administered 2019-10-30: 4 mg via INTRAVENOUS

## 2019-10-30 MED ORDER — BUPIVACAINE IN DEXTROSE 0.75-8.25 % IT SOLN
INTRATHECAL | Status: DC | PRN
  Administered 2019-10-30: 1.6 mL via INTRATHECAL

## 2019-10-30 MED ORDER — SIMETHICONE 80 MG PO CHEW
80.0000 mg | CHEWABLE_TABLET | Freq: Three times a day (TID) | ORAL | Status: DC
  Administered 2019-10-31 – 2019-11-01 (×5): 80 mg via ORAL
  Filled 2019-10-30 (×5): qty 1

## 2019-10-30 MED ORDER — NALBUPHINE HCL 10 MG/ML IJ SOLN: 5.0000 mg | Freq: Once | INTRAMUSCULAR | Status: DC | PRN

## 2019-10-30 MED ORDER — BUPIVACAINE IN DEXTROSE 0.75-8.25 % IT SOLN: INTRATHECAL | Status: DC | PRN

## 2019-10-30 MED ORDER — SCOPOLAMINE 1 MG/3DAYS TD PT72
1.0000 | MEDICATED_PATCH | Freq: Once | TRANSDERMAL | Status: DC
  Administered 2019-10-30: 1.5 mg via TRANSDERMAL

## 2019-10-30 MED ORDER — SODIUM CHLORIDE 0.9% FLUSH: 3.0000 mL | INTRAVENOUS | Status: DC | PRN

## 2019-10-30 MED ORDER — LACTATED RINGERS IV SOLN: INTRAVENOUS | Status: DC

## 2019-10-30 MED ORDER — TETANUS-DIPHTH-ACELL PERTUSSIS 5-2.5-18.5 LF-MCG/0.5 IM SUSP: 0.5000 mL | Freq: Once | INTRAMUSCULAR | Status: DC

## 2019-10-30 MED ORDER — CEFAZOLIN SODIUM-DEXTROSE 2-4 GM/100ML-% IV SOLN
INTRAVENOUS | Status: AC
  Filled 2019-10-30: qty 100

## 2019-10-30 MED ORDER — LACTATED RINGERS IV SOLN
INTRAVENOUS | Status: DC
  Administered 2019-10-30: 999 mL via INTRAVENOUS

## 2019-10-30 MED ORDER — ONDANSETRON HCL 4 MG/2ML IJ SOLN
INTRAMUSCULAR | Status: AC
  Filled 2019-10-30: qty 2

## 2019-10-30 MED ORDER — KETOROLAC TROMETHAMINE 30 MG/ML IJ SOLN
30.0000 mg | Freq: Four times a day (QID) | INTRAMUSCULAR | Status: AC
  Administered 2019-10-30 – 2019-10-31 (×3): 30 mg via INTRAVENOUS
  Filled 2019-10-30 (×3): qty 1

## 2019-10-30 MED ORDER — PHENYLEPHRINE 40 MCG/ML (10ML) SYRINGE FOR IV PUSH (FOR BLOOD PRESSURE SUPPORT)
PREFILLED_SYRINGE | INTRAVENOUS | Status: AC
  Filled 2019-10-30: qty 10

## 2019-10-30 MED ORDER — KETOROLAC TROMETHAMINE 30 MG/ML IJ SOLN
30.0000 mg | Freq: Once | INTRAMUSCULAR | Status: AC | PRN
  Administered 2019-10-30: 30 mg via INTRAVENOUS

## 2019-10-30 MED ORDER — PRENATAL MULTIVITAMIN CH
1.0000 | ORAL_TABLET | Freq: Every day | ORAL | Status: DC
  Administered 2019-10-31 – 2019-11-01 (×2): 1 via ORAL
  Filled 2019-10-30 (×2): qty 1

## 2019-10-30 MED ORDER — BUPIVACAINE HCL (PF) 0.25 % IJ SOLN
INTRAMUSCULAR | Status: AC
  Filled 2019-10-30: qty 30

## 2019-10-30 MED ORDER — OXYTOCIN-SODIUM CHLORIDE 30-0.9 UT/500ML-% IV SOLN: 2.5000 [IU]/h | INTRAVENOUS | Status: AC

## 2019-10-30 MED ORDER — OXYTOCIN-SODIUM CHLORIDE 30-0.9 UT/500ML-% IV SOLN
INTRAVENOUS | Status: AC
  Filled 2019-10-30: qty 500

## 2019-10-30 MED ORDER — OXYCODONE HCL 5 MG PO TABS: 5.0000 mg | ORAL_TABLET | Freq: Once | ORAL | Status: DC | PRN

## 2019-10-30 MED ORDER — PHENYLEPHRINE HCL-NACL 20-0.9 MG/250ML-% IV SOLN
INTRAVENOUS | Status: DC | PRN
  Administered 2019-10-30: 60 ug/min via INTRAVENOUS

## 2019-10-30 MED ORDER — DIPHENHYDRAMINE HCL 50 MG/ML IJ SOLN
INTRAMUSCULAR | Status: AC
  Filled 2019-10-30: qty 1

## 2019-10-30 MED ORDER — NALBUPHINE HCL 10 MG/ML IJ SOLN: 5.0000 mg | INTRAMUSCULAR | Status: DC | PRN

## 2019-10-30 MED ORDER — HYDROMORPHONE HCL 1 MG/ML IJ SOLN
INTRAMUSCULAR | Status: AC
  Filled 2019-10-30: qty 0.5

## 2019-10-30 MED ORDER — PHENYLEPHRINE HCL (PRESSORS) 10 MG/ML IV SOLN
INTRAVENOUS | Status: DC | PRN
  Administered 2019-10-30: 40 ug via INTRAVENOUS

## 2019-10-30 MED ORDER — IBUPROFEN 800 MG PO TABS
800.0000 mg | ORAL_TABLET | Freq: Four times a day (QID) | ORAL | Status: DC
  Administered 2019-10-31 – 2019-11-01 (×4): 800 mg via ORAL
  Filled 2019-10-30 (×4): qty 1

## 2019-10-30 MED ORDER — HYDROMORPHONE HCL 1 MG/ML IJ SOLN
0.2500 mg | INTRAMUSCULAR | Status: DC | PRN
  Administered 2019-10-30: 0.25 mg via INTRAVENOUS

## 2019-10-30 MED ORDER — CEFAZOLIN SODIUM-DEXTROSE 2-4 GM/100ML-% IV SOLN
2.0000 g | INTRAVENOUS | Status: AC
  Administered 2019-10-30: 2 g via INTRAVENOUS

## 2019-10-30 MED ORDER — SODIUM CHLORIDE 0.9 % IR SOLN
Status: DC | PRN
  Administered 2019-10-30: 1000 mL

## 2019-10-30 MED ORDER — MORPHINE SULFATE (PF) 0.5 MG/ML IJ SOLN
INTRAMUSCULAR | Status: AC
  Filled 2019-10-30: qty 10

## 2019-10-30 MED ORDER — MORPHINE SULFATE (PF) 0.5 MG/ML IJ SOLN
INTRAMUSCULAR | Status: DC | PRN
  Administered 2019-10-30: 150 ug via INTRATHECAL

## 2019-10-30 MED ORDER — NALOXONE HCL 0.4 MG/ML IJ SOLN: 0.4000 mg | INTRAMUSCULAR | Status: DC | PRN

## 2019-10-30 MED ORDER — DIPHENHYDRAMINE HCL 50 MG/ML IJ SOLN
12.5000 mg | INTRAMUSCULAR | Status: DC | PRN
  Administered 2019-10-30: 12.5 mg via INTRAVENOUS

## 2019-10-30 MED ORDER — PROMETHAZINE HCL 25 MG/ML IJ SOLN: 6.2500 mg | INTRAMUSCULAR | Status: DC | PRN

## 2019-10-30 MED ORDER — SIMETHICONE 80 MG PO CHEW: 80.0000 mg | CHEWABLE_TABLET | ORAL | Status: DC | PRN

## 2019-10-30 MED ORDER — FENTANYL CITRATE (PF) 100 MCG/2ML IJ SOLN
INTRAMUSCULAR | Status: DC | PRN
  Administered 2019-10-30: 15 ug via INTRATHECAL

## 2019-10-30 MED ORDER — ONDANSETRON HCL 4 MG/2ML IJ SOLN: 4.0000 mg | Freq: Three times a day (TID) | INTRAMUSCULAR | Status: DC | PRN

## 2019-10-30 MED ORDER — SENNOSIDES-DOCUSATE SODIUM 8.6-50 MG PO TABS
2.0000 | ORAL_TABLET | ORAL | Status: DC
  Administered 2019-10-31 – 2019-11-01 (×2): 2 via ORAL
  Filled 2019-10-30 (×2): qty 2

## 2019-10-30 MED ORDER — BUPIVACAINE HCL (PF) 0.25 % IJ SOLN
INTRAMUSCULAR | Status: DC | PRN
  Administered 2019-10-30: 20 mL

## 2019-10-30 MED ORDER — ZOLPIDEM TARTRATE 5 MG PO TABS: 5.0000 mg | ORAL_TABLET | Freq: Every evening | ORAL | Status: DC | PRN

## 2019-10-30 MED ORDER — OXYTOCIN-SODIUM CHLORIDE 30-0.9 UT/500ML-% IV SOLN
INTRAVENOUS | Status: DC | PRN
  Administered 2019-10-30 (×2): 30 [IU] via INTRAVENOUS

## 2019-10-30 SURGICAL SUPPLY — 34 items
BENZOIN TINCTURE PRP APPL 2/3 (GAUZE/BANDAGES/DRESSINGS) ×2 IMPLANT
CHLORAPREP W/TINT 26ML (MISCELLANEOUS) ×2 IMPLANT
CLAMP CORD UMBIL (MISCELLANEOUS) IMPLANT
CLOSURE STERI STRIP 1/2 X4 (GAUZE/BANDAGES/DRESSINGS) ×2 IMPLANT
CLOTH BEACON ORANGE TIMEOUT ST (SAFETY) ×2 IMPLANT
DRSG OPSITE POSTOP 4X10 (GAUZE/BANDAGES/DRESSINGS) ×2 IMPLANT
ELECT REM PT RETURN 9FT ADLT (ELECTROSURGICAL) ×2
ELECTRODE REM PT RTRN 9FT ADLT (ELECTROSURGICAL) ×1 IMPLANT
EXTRACTOR VACUUM M CUP 4 TUBE (SUCTIONS) IMPLANT
GLOVE BIO SURGEON STRL SZ7.5 (GLOVE) ×2 IMPLANT
GLOVE BIOGEL PI IND STRL 7.0 (GLOVE) ×1 IMPLANT
GLOVE BIOGEL PI INDICATOR 7.0 (GLOVE) ×1
GOWN STRL REUS W/TWL LRG LVL3 (GOWN DISPOSABLE) ×4 IMPLANT
KIT ABG SYR 3ML LUER SLIP (SYRINGE) IMPLANT
NEEDLE HYPO 22GX1.5 SAFETY (NEEDLE) ×2 IMPLANT
NEEDLE HYPO 25X5/8 SAFETYGLIDE (NEEDLE) IMPLANT
NEEDLE SPNL 20GX3.5 QUINCKE YW (NEEDLE) IMPLANT
NS IRRIG 1000ML POUR BTL (IV SOLUTION) ×2 IMPLANT
PACK C SECTION WH (CUSTOM PROCEDURE TRAY) ×2 IMPLANT
PENCIL SMOKE EVAC W/HOLSTER (ELECTROSURGICAL) ×2 IMPLANT
SUT MNCRL 0 VIOLET CTX 36 (SUTURE) ×2 IMPLANT
SUT MNCRL AB 3-0 PS2 27 (SUTURE) IMPLANT
SUT MON AB 2-0 CT1 27 (SUTURE) ×2 IMPLANT
SUT MON AB-0 CT1 36 (SUTURE) ×4 IMPLANT
SUT MONOCRYL 0 CTX 36 (SUTURE) ×2
SUT PLAIN 0 NONE (SUTURE) IMPLANT
SUT PLAIN 2 0 (SUTURE)
SUT PLAIN 2 0 XLH (SUTURE) IMPLANT
SUT PLAIN ABS 2-0 CT1 27XMFL (SUTURE) IMPLANT
SYR 20CC LL (SYRINGE) IMPLANT
SYR CONTROL 10ML LL (SYRINGE) ×2 IMPLANT
TOWEL OR 17X24 6PK STRL BLUE (TOWEL DISPOSABLE) ×2 IMPLANT
TRAY FOLEY W/BAG SLVR 14FR LF (SET/KITS/TRAYS/PACK) ×2 IMPLANT
WATER STERILE IRR 1000ML POUR (IV SOLUTION) ×2 IMPLANT

## 2019-10-30 NOTE — Transfer of Care (Signed)
Immediate Anesthesia Transfer of Care Note  Patient: Shannon Davenport  Procedure(s) Performed: Repeat CESAREAN SECTION (N/A )  Patient Location: PACU  Anesthesia Type:Spinal  Level of Consciousness: awake, alert  and oriented  Airway & Oxygen Therapy: Patient Spontanous Breathing  Post-op Assessment: Report given to RN and Post -op Vital signs reviewed and stable  Post vital signs: Reviewed and stable  Last Vitals:  Vitals Value Taken Time  BP 130/80 10/30/19 1430  Temp 36.3 C 10/30/19 1407  Pulse 99 10/30/19 1433  Resp 18 10/30/19 1433  SpO2 98 % 10/30/19 1433  Vitals shown include unvalidated device data.  Last Pain:  Vitals:   10/30/19 1407  TempSrc: Oral         Complications: No complications documented.

## 2019-10-30 NOTE — Anesthesia Procedure Notes (Signed)
Spinal  Patient location during procedure: OB End time: 10/30/2019 1:05 PM Staffing Performed: anesthesiologist  Anesthesiologist: Lynda Rainwater, MD Preanesthetic Checklist Completed: patient identified, IV checked, risks and benefits discussed, surgical consent, monitors and equipment checked, pre-op evaluation and timeout performed Spinal Block Patient position: sitting Prep: DuraPrep and site prepped and draped Patient monitoring: heart rate, cardiac monitor, continuous pulse ox and blood pressure Approach: midline Location: L3-4 Injection technique: single-shot Needle Needle type: Pencan  Needle gauge: 24 G Needle length: 10 cm Assessment Sensory level: T4

## 2019-10-30 NOTE — Op Note (Signed)
Cesarean Section Procedure Note  Indications: previous myomectomy and previous uterine incision kerr x one  Pre-operative Diagnosis: 37 week 0 day pregnancy.  Post-operative Diagnosis: same  Surgeon: Lovenia Kim   Assistants: Arlis Porta, CNM  Anesthesia: Local anesthesia 0.25.% bupivacaine and Spinal anesthesia  ASA Class: 2  Procedure Details  The patient was seen in the Holding Room. The risks, benefits, complications, treatment options, and expected outcomes were discussed with the patient.  The patient concurred with the proposed plan, giving informed consent. The risks of anesthesia, infection, bleeding and possible injury to other organs discussed. Injury to bowel, bladder, or ureter with possible need for repair discussed. Possible need for transfusion with secondary risks of hepatitis or HIV acquisition discussed. Post operative complications to include but not limited to DVT, PE and Pneumonia noted. The site of surgery properly noted/marked. The patient was taken to Operating Room # C, identified as Shannon Davenport and the procedure verified as C-Section Delivery. A Time Out was held and the above information confirmed.  After induction of anesthesia, the patient was draped and prepped in the usual sterile manner. A Pfannenstiel incision was made and carried down through the subcutaneous tissue to the fascia. Fascial incision was made and extended transversely using Mayo scissors. The fascia was separated from the underlying rectus tissue superiorly and inferiorly. The peritoneum was identified and entered. Peritoneal incision was extended longitudinally. The utero-vesical peritoneal reflection was incised transversely and the bladder flap was bluntly freed from the lower uterine segment. A low transverse uterine incision(Kerr hysterotomy) was made. Delivered from OA presentation with vacuum assistance  was a  female with Apgar scores of 8 at one minute and 9 at five minutes. Bulb  suctioning gently performed. Neonatal team in attendance.After the umbilical cord was clamped and cut cord blood was obtained for evaluation. The placenta was removed intact and appeared normal. The uterus was curetted with a dry lap pack. Good hemostasis was noted.The uterine outline, tubes and ovaries appeared normal. The uterine incision was closed with running locked sutures of 0 Monocryl x 2 layers. Hemostasis was observed. Lavage was carried out until clear.The parietal peritoneum was closed with a running 2-0 Monocryl suture. The fascia was then reapproximated with running sutures of 0 Monocryl. The skin was reapproximated with 3-0 monocryl after Wilson closure with 2-0 plain.  Instrument, sponge, and needle counts were correct prior the abdominal closure and at the conclusion of the case.   Findings: FTLF, OA, post placenta, nl adnexa  Estimated Blood Loss:  300 mL         Drains: foley                 Specimens: placenta                 Complications:  None; patient tolerated the procedure well.         Disposition: PACU - hemodynamically stable.         Condition: stable  Attending Attestation: I performed the procedure.

## 2019-10-30 NOTE — Anesthesia Postprocedure Evaluation (Signed)
Anesthesia Post Note  Patient: ANNALEE MEYERHOFF  Procedure(s) Performed: Repeat CESAREAN SECTION (N/A )     Patient location during evaluation: PACU Anesthesia Type: Spinal Level of consciousness: awake and alert Pain management: pain level controlled Vital Signs Assessment: post-procedure vital signs reviewed and stable Respiratory status: spontaneous breathing, nonlabored ventilation and respiratory function stable Cardiovascular status: blood pressure returned to baseline and stable Postop Assessment: no apparent nausea or vomiting Anesthetic complications: no   No complications documented.  Last Vitals:  Vitals:   10/30/19 1500 10/30/19 1514  BP: 127/90 (!) 146/70  Pulse: 97 96  Resp: 20 18  Temp: 36.5 C 36.8 C  SpO2: 100% 100%    Last Pain:  Vitals:   10/30/19 1514  TempSrc: Oral  PainSc: (P) 0-No pain   Pain Goal:    LLE Motor Response: Purposeful movement (10/30/19 1500) LLE Sensation: Numbness (10/30/19 1500) RLE Motor Response: Purposeful movement (10/30/19 1500) RLE Sensation: Numbness (10/30/19 1500)     Epidural/Spinal Function Cutaneous sensation: (P) Vague (10/30/19 1514), Patient able to flex knees: (P) Yes (10/30/19 1514), Patient able to lift hips off bed: (P) No (10/30/19 1514), Back pain beyond tenderness at insertion site: (P) No (10/30/19 1514), Progressively worsening motor and/or sensory loss: (P) No (10/30/19 1514), Bowel and/or bladder incontinence post epidural: (P) No (10/30/19 1514)  Lynda Rainwater

## 2019-10-30 NOTE — H&P (Signed)
Shannon Davenport is a 39 y.o. female presenting for rpt csection due to history of myomectomy. OB History    Gravida  6   Para  1   Term      Preterm  1   AB  4   Living  1     SAB  1   TAB  2   Ectopic  1   Multiple  0   Live Births  1          Past Medical History:  Diagnosis Date  . Ectopic pregnancy without intrauterine pregnancy 12/13/2014  . Ectopic pregnancy without intrauterine pregnancy/Laprascopic RT salpingectomy (9/15) 12/13/2014  . Fibroid   . GERD (gastroesophageal reflux disease)   . 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 History of pre-eclampsia in prior pregnancy, currently pregnant   . Pregnancy induced hypertension    Past Surgical History:  Procedure Laterality Date  . bunioectomy Right 2008  . CESAREAN SECTION N/A 03/13/2017   Procedure: Primary CESAREAN SECTION;  Surgeon: Brien Few, MD;  Location: [place];  Service: Obstetrics;  Laterality: N/A;  EDD: 05/08/17  . LAPAROSCOPY N/A 12/13/2014   Procedure: LAPAROSCOPY OPERATIVE;  Surgeon: Brien Few, MD;  Location: [facility];  Service: Gynecology;  Laterality: N/A;  . MYOMECTOMY N/A 03/07/2015   Procedure: Exploratory Laparotomy MYOMECTOMY;  Surgeon: Servando Salina, MD;  Location: [facility];  Service: Gynecology;  Laterality: N/A;  90 min.  Marland Kitchen UNILATERAL SALPINGECTOMY Right 12/13/2014   Procedure: UNILATERAL SALPINGECTOMY;  Surgeon: Brien Few, MD;  Location: [facility];  Service: Gynecology;  Laterality: Right;   Family History: family history includes Breast cancer in her maternal grandmother; Heart block in her father and mother; Heart disease in her mother; Hyperlipidemia in her mother; Hypertension in her mother; Kidney cancer in her father. Social History:  reports that she has quit smoking. Her smoking use included cigarettes. She has a 1.75 pack-year smoking history. She has never used smokeless tobacco. She reports that she does not drink alcohol and does not use  drugs.     Maternal Diabetes: No Genetic Screening: Abnormal:  Results: Other:insufficient fetal DNA on NIPS Maternal Ultrasounds/Referrals: Normal Fetal Ultrasounds or other Referrals:  Referred to Materal Fetal Medicine  Maternal Substance Abuse:  No Significant Maternal Medications:  None Significant Maternal Lab Results:  Group B Strep negative Other Comments:  None  Review of Systems  Constitutional: Negative.   All other systems reviewed and are negative.  Maternal Medical History:  Fetal activity: Perceived fetal activity is normal.   Last perceived fetal movement was within the past hour.    Prenatal Complications - Diabetes: none.      Last menstrual period 02/13/2019, currently breastfeeding. Maternal Exam:  Uterine Assessment: Contraction strength is mild.  Contraction frequency is irregular.   Abdomen: Patient reports no abdominal tenderness. Surgical scars: low transverse.   Fetal presentation: vertex  Introitus: Normal vulva. Normal vagina.  Ferning test: not done.  Nitrazine test: not done. Amniotic fluid character: not assessed.  Pelvis: questionable for delivery.      Physical Exam Vitals and nursing note reviewed.  Constitutional:      Appearance: Normal appearance.  HENT:     Head: Normocephalic and atraumatic.  Cardiovascular:     Rate and Rhythm: Normal rate and regular rhythm.     Pulses: Normal pulses.     Heart sounds: Normal heart sounds.  Pulmonary:     Effort: Pulmonary effort is normal.     Breath sounds:  Normal breath sounds.  Abdominal:     General: Bowel sounds are normal.     Palpations: Abdomen is soft.  Genitourinary:    General: Normal vulva.  Musculoskeletal:        General: Normal range of motion.     Cervical back: Normal range of motion.  Neurological:     General: No focal deficit present.  Psychiatric:        Mood and Affect: Mood normal.     Prenatal labs: ABO, Rh: --/--/O POS (07/31 4628) Antibody: NEG  (07/31 0842) Rubella: Immune (01/28 0000) RPR: NON REACTIVE (07/31 0842)  HBsAg: Negative (01/28 0000)  HIV: Non-reactive (01/28 0000)  GBS:   neg  Assessment/Plan: 37 week IUP HIstory of myomectomy Previous csection Rpt csection   Shannon Davenport J 10/30/2019, 6:44 AM

## 2019-10-30 NOTE — Anesthesia Preprocedure Evaluation (Signed)
Anesthesia Evaluation  Patient identified by MRN, date of birth, ID band Patient awake    Reviewed: Allergy & Precautions, H&P , NPO status , Patient's Chart, lab work & pertinent test results  History of Anesthesia Complications Negative for: history of anesthetic complications  Airway Mallampati: II  TM Distance: >3 FB Neck ROM: full    Dental no notable dental hx. (+) Dental Advisory Given   Pulmonary former smoker,  Takes no meds for asthma, denies having it   Pulmonary exam normal breath sounds clear to auscultation       Cardiovascular Exercise Tolerance: Good hypertension, (-) angina(-) CAD negative cardio ROS Normal cardiovascular exam Rhythm:regular Rate:Normal     Neuro/Psych negative neurological ROS  negative psych ROS   GI/Hepatic negative GI ROS, Neg liver ROS,   Endo/Other  negative endocrine ROS  Renal/GU negative Renal ROS     Musculoskeletal negative musculoskeletal ROS (+)   Abdominal   Peds  Hematology negative hematology ROS (+)   Anesthesia Other Findings    Reproductive/Obstetrics negative OB ROS (+) Pregnancy Large 10cm fibroid                             Anesthesia Physical  Anesthesia Plan  ASA: II  Anesthesia Plan: Spinal   Post-op Pain Management:    Induction:   PONV Risk Score and Plan: 2 and Treatment may vary due to age or medical condition and Ondansetron  Airway Management Planned: Natural Airway  Additional Equipment:   Intra-op Plan:   Post-operative Plan:   Informed Consent: I have reviewed the patients History and Physical, chart, labs and discussed the procedure including the risks, benefits and alternatives for the proposed anesthesia with the patient or authorized representative who has indicated his/her understanding and acceptance.     Dental advisory given  Plan Discussed with: Anesthesiologist, CRNA and  Surgeon  Anesthesia Plan Comments:         Anesthesia Quick Evaluation

## 2019-10-31 LAB — BIRTH TISSUE RECOVERY COLLECTION (PLACENTA DONATION)

## 2019-10-31 LAB — CBC
HCT: 31.8 % — ABNORMAL LOW (ref 36.0–46.0)
Hemoglobin: 10 g/dL — ABNORMAL LOW (ref 12.0–15.0)
MCH: 28.6 pg (ref 26.0–34.0)
MCHC: 31.4 g/dL (ref 30.0–36.0)
MCV: 90.9 fL (ref 80.0–100.0)
Platelets: 173 10*3/uL (ref 150–400)
RBC: 3.5 MIL/uL — ABNORMAL LOW (ref 3.87–5.11)
RDW: 14.6 % (ref 11.5–15.5)
WBC: 7.9 10*3/uL (ref 4.0–10.5)
nRBC: 0 % (ref 0.0–0.2)

## 2019-10-31 MED ORDER — WITCH HAZEL-GLYCERIN EX PADS: 1.0000 "application " | MEDICATED_PAD | CUTANEOUS | Status: DC | PRN

## 2019-10-31 MED ORDER — DIBUCAINE (PERIANAL) 1 % EX OINT: 1.0000 "application " | TOPICAL_OINTMENT | CUTANEOUS | Status: DC | PRN

## 2019-10-31 NOTE — Lactation Note (Signed)
This note was copied from a baby's chart. Lactation Consultation Note  Patient Name: Shannon Davenport [id number] Date: 10/31/2019 Reason for consult: Initial assessment;Early term 37-38.6wks;Other (Comment) (DAT+) P2, 12 hour ETI, DAT female infant.  Mom's hx: C/S delivery, AMA, HSV Mom is experienced at breastfeeding, she BF her 39 year old daughter for 25 months. Per mom, infant had 2 voids since birth. LC entered room, infant asleep in basinet. Per mom, she wants LC to review  hand expression with her , mom taught back and easily expressed 15 mls of colostrum in two bullets.  Infant became awake and started cuing to BF, mom latched infant on her right breast using the football hold position, infant latched with wide mouth and top lip flanged outward. Infant was still BF after 10 minutes when LC left the room, mom will offer 5 mls of colostrum that she had hand expressed earlier after feeding infant at breast. The other 10 mls of colostrum was placed in the fridge for future use, mom plans to supplement infant with her EBM from hand expression and pumping. Mom knows to BF infant according to cues, on demand, 8 to 12+ times within 24 hours. Breastfeeding supplemental guide sheet given based on infant's age/ hours of life. Mom plans to use DEBP every 3 hours for 15 minutes on initial setting. Mom shown how to use DEBP & how to disassemble, clean, & reassemble parts. Mom made aware of O/P services, breastfeeding support groups, community resources, and our phone # for post-discharge questions.    Maternal Data Formula Feeding for Exclusion: No Has patient been taught Hand Expression?: Yes Does the patient have breastfeeding experience prior to this delivery?: Yes  Feeding Feeding Type: Breast Fed  LATCH Score Latch: Grasps breast easily, tongue down, lips flanged, rhythmical sucking.  Audible Swallowing: Spontaneous and intermittent  Type of Nipple: Everted at rest and after  stimulation  Comfort (Breast/Nipple): Soft / non-tender  Hold (Positioning): Assistance needed to correctly position infant at breast and maintain latch.  LATCH Score: 9  Interventions Interventions: Breast feeding basics reviewed;Assisted with latch;Breast compression;Skin to skin;Adjust position;Breast massage;Support pillows;DEBP;Position options;Hand express;Expressed milk  Lactation Tools Discussed/Used WIC Program: No Pump Review: Setup, frequency, and cleaning;Milk Storage Initiated by:: Shannon Davenport, IBCLC Date initiated:: 10/31/19   Consult Status Consult Status: Follow-up Date: 10/31/19 Follow-up type: In-patient    Shannon Davenport 10/31/2019, 2:16 AM

## 2019-10-31 NOTE — Lactation Note (Signed)
This note was copied from a baby's chart. Lactation Consultation Note  Patient Name: Shannon Davenport SFSEL'T Date: 10/31/2019 Reason for consult: Follow-up assessment;Early term 24-38.6wks Baby 31hrs old, wt loss 4%, mom sitting in chair eating dinner, sister at bedside holding sleeping baby. Mom reports BF and bottle fed EBM to first baby for 58months, baby was in NICU (baby is now 39yo). Goal is to nurse this baby 1+ year. Mom reports some nipple tenderness, states baby just fed, nurses from both side, feedings are lasting ~96min total. Reviewed engorgement and how to manage. Mom agreed to call [facility] for next feeding to check latch. Mom with no further concerns. BGilliam, RN, IBCLC  Maternal Data    Feeding  LATCH Score  Interventions    Lactation Tools Discussed/Used     Consult Status Consult Status: Follow-up Date: 11/01/19 Follow-up type: In-patient    Shannon Davenport 10/31/2019, 8:42 PM

## 2019-11-01 MED ORDER — COCONUT OIL OIL: 1.0000 "application " | TOPICAL_OIL | 0 refills | Status: DC | PRN

## 2019-11-01 MED ORDER — IBUPROFEN 800 MG PO TABS: 800.0000 mg | ORAL_TABLET | Freq: Four times a day (QID) | ORAL | 0 refills | Status: DC

## 2019-11-01 MED ORDER — SENNOSIDES-DOCUSATE SODIUM 8.6-50 MG PO TABS: 2.0000 | ORAL_TABLET | ORAL | Status: DC

## 2019-11-01 MED ORDER — ACETAMINOPHEN 500 MG PO TABS: 1000.0000 mg | ORAL_TABLET | Freq: Four times a day (QID) | ORAL | 2 refills | Status: AC | PRN

## 2019-11-01 MED ORDER — SIMETHICONE 80 MG PO CHEW: 80.0000 mg | CHEWABLE_TABLET | ORAL | 0 refills | Status: DC | PRN

## 2019-11-01 MED ORDER — OXYCODONE HCL 5 MG PO TABS: 5.0000 mg | ORAL_TABLET | Freq: Three times a day (TID) | ORAL | 0 refills | Status: DC | PRN

## 2019-11-01 NOTE — Lactation Note (Signed)
This note was copied from a baby's chart. Lactation Consultation Note  Patient Name: Shannon Davenport [id number] Date: 11/01/2019   "Shannon Davenport" is 84 hrs old. She is nursing very well; infant has frequent, audible swallows. Mom says her breasts feel and look heavier to her. I assisted Mom with slight positioning changes & she was pleased. I have no concerns.   I don't think Mom needs to do any pumping, but if she were to do so, size 27 flanges would be appropriate for her.   Mom reports that she was able to pump 40-50 oz/day with her 1st child.   Mom knows how to reach [place] if she has any questions post-discharge.   Shannon Davenport [facility] 11/01/2019, 10:14 AM

## 2019-11-01 NOTE — Discharge Summary (Signed)
OB Discharge Summary  Patient Name: Shannon Davenport DOB: 1981/03/29 MRN: [id number]  Date of admission: 10/30/2019 Delivering provider: Brien Few   Admitting diagnosis: History of myomectomy [Z98.890] Previous cesarean delivery affecting pregnancy [O34.219] Intrauterine pregnancy: [redacted]w[redacted]d     Secondary diagnosis: Patient Active Problem List   Diagnosis Date Noted  . History of myomectomy 10/30/2019  . Status post repeat low transverse cesarean section 8/2 10/30/2019  . Postpartum care following cesarean delivery 8/2 10/30/2019  . Previous cesarean delivery affecting pregnancy 10/30/2019  . Advanced maternal age in multigravida 11/27/2016  . Asthma, mild persistent 02/02/2012   Additional problems:none   Date of discharge: 11/01/2019   Discharge diagnosis: Principal Problem:   Postpartum care following cesarean delivery 8/2 Active Problems:   History of myomectomy   Status post repeat low transverse cesarean section 8/2   Previous cesarean delivery affecting pregnancy                                                              Post partum procedures:none  Pain control: Spinal   Complications: None  Hospital course:  Sceduled C/S   39 y.o. yo [id number] at [redacted]w[redacted]d was admitted to the hospital 10/30/2019 for scheduled cesarean section with the following indication:Prior Uterine Surgery.Delivery details are as follows:  Membrane Rupture Time/Date: 1:27 PM ,10/30/2019   Delivery Method:C-Section, Vacuum Assisted  Details of operation can be found in separate operative note.  Patient had an uncomplicated postpartum course.  She is ambulating, tolerating a regular diet, passing flatus, and urinating well. Patient is discharged home in stable condition on  11/01/19        Newborn Data: Birth date:10/30/2019  Birth time:1:29 PM  Gender:Female  Living status:Living  Apgars:8 ,9  Weight:3075 g     Physical exam  Vitals:   10/31/19 1450 10/31/19 1948 10/31/19 2233 11/01/19 0539   BP: 138/87 139/90 138/86 126/83  Pulse: 82 75 100 89  Resp: 16 18 16 15   Temp: 97.6 F (36.4 C) 97.7 F (36.5 C) 97.9 F (36.6 C) 98 F (36.7 C)  TempSrc: Oral Oral Oral Oral  SpO2:  100% 98% 100%  Weight:      Height:       General: alert, cooperative and no distress Lochia: appropriate Uterine Fundus: firm Incision: Healing well with no significant drainage DVT Evaluation: No cords or calf tenderness. No significant calf/ankle edema. Labs: Lab Results  Component Value Date   WBC 7.9 10/31/2019   HGB 10.0 (L) 10/31/2019   HCT 31.8 (L) 10/31/2019   MCV 90.9 10/31/2019   PLT 173 10/31/2019   CMP Latest Ref Rng & Units 03/14/2017  Glucose 65 - 99 mg/dL 97  BUN 6 - 20 mg/dL 11  Creatinine 0.44 - 1.00 mg/dL 0.79  Sodium 135 - 145 mmol/L 132(L)  Potassium 3.5 - 5.1 mmol/L 4.8  Chloride 101 - 111 mmol/L 101  CO2 22 - 32 mmol/L 23  Calcium 8.9 - 10.3 mg/dL 7.2(L)  Total Protein 6.5 - 8.1 g/dL 5.2(L)  Total Bilirubin 0.3 - 1.2 mg/dL 0.3  Alkaline Phos 38 - 126 U/L 123  AST 15 - 41 U/L 38  ALT 14 - 54 U/L 23   Edinburgh Postnatal Depression Scale Screening Tool 10/30/2019 10/30/2019  I have been able to laugh  and see the funny side of things. 0 (No Data)  I have looked forward with enjoyment to things. 0 -  I have blamed myself unnecessarily when things went wrong. 1 -  I have been anxious or worried for no good reason. 0 -  I have felt scared or panicky for no good reason. 0 -  Things have been getting on top of me. 0 -  I have been so unhappy that I have had difficulty sleeping. 0 -  I have felt sad or miserable. 0 -  I have been so unhappy that I have been crying. 1 -  The thought of harming myself has occurred to me. 0 -  Edinburgh Postnatal Depression Scale Total 2 -   Vaccines: TDaP UTD         Flu    UTD  Discharge instruction:  per After Visit Summary,  [facility] OB booklet and  "Understanding Mother & Baby Care" hospital booklet  After Visit Meds:   Allergies as of 11/01/2019   No Known Allergies     Medication List    STOP taking these medications   aspirin EC 81 MG tablet     TAKE these medications   acetaminophen 500 MG tablet Commonly known as: TYLENOL Take 2 tablets (1,000 mg total) by mouth every 6 (six) hours as needed.   coconut oil Oil Apply 1 application topically as needed.   ibuprofen 800 MG tablet Commonly known as: ADVIL Take 1 tablet (800 mg total) by mouth every 6 (six) hours.   oxyCODONE 5 MG immediate release tablet Commonly known as: Roxicodone Take 1 tablet (5 mg total) by mouth every 8 (eight) hours as needed.   prenatal multivitamin Tabs tablet Take 1 tablet by mouth daily at 12 noon.   senna-docusate 8.6-50 MG tablet Commonly known as: Senokot-S Take 2 tablets by mouth daily. Start taking on: November 02, 2019   simethicone 80 MG chewable tablet Commonly known as: MYLICON Chew 1 tablet (80 mg total) by mouth as needed for flatulence.       Diet: routine diet  Activity: Advance as tolerated. Pelvic rest for 6 weeks.   Postpartum contraception: Not Discussed  Newborn Data: Live born female  Birth Weight: 6 lb 12.5 oz (3075 g) APGAR: 47, 9  Newborn Delivery   Birth date/time: 10/30/2019 13:29:00 Delivery type: C-Section, Vacuum Assisted Trial of labor: No C-section categorization: Repeat      named [place] Baby Feeding: Breast Disposition:home with mother   Delivery Report:  Review the Delivery Report for details.    Follow up:  Follow-up Information    Brien Few, MD. Schedule an appointment as soon as possible for a visit in 6 week(s).   Specialty: Obstetrics and Gynecology Contact information: [place] [address] [phone]                 Signed: Otilio Carpen, MSN 11/01/2019, 10:33 AM

## 2019-11-02 ENCOUNTER — Ambulatory Visit: Payer: Self-pay

## 2019-11-02 NOTE — Lactation Note (Signed)
This note was copied from a baby's chart. Lactation Consultation Note  Patient Name: Shannon Davenport [id number] Date: 11/02/2019 Reason for consult: Follow-up assessment;Early term 37-38.6wks;Infant weight loss;Other (Comment) (7 % weight loss/ milk in/ and mom feeding at the breast and pumping)  Baby is 26 hour old , still on photo tx. Serum Bili drawn at 1403 - 10.2.  Baby asleep on lights and per mom  last fed at 1140 and 1159 at the breast 20 and 25 mins. Voiding and stooling ( green ) well.  Mom was pumping when LC entered the room and her milk is in approx , 3-4 oz and breast softened down.  LC reviewed Early term , 7 % weight loss , jaundice baby's potential feeding behavior. LC discussed 2nd baby volume can be greater and let down can be quicker.  If the baby is sluggish to start to feed , may need to give and appetizer of EBM  And then latch . If not latch the baby STS and feed for 15 -20 mins 1st breast ( assuring depth at the breast ) and then supplementing with EBM ( 30 ml ) ( free calories baby doesn't need to work hard for ) . ( goal is to increase wets and stools so the jaundice level will decrease quicker and bring the weight up steadily.  When the weight increases back to birth weight , can just breast feed STS . LC reviewed sore nipple and engorgement prevention and tx.  Per mom has a DEBP at home, and hand pump.  Mom is  experienced breast feeding mom.  LC offered to request and LC  O/P appt for next week and mom receptive.  LC placed a request.       Maternal Data    Feeding Feeding Type:  (baby has fed recently and still on the photo tx)  LATCH Score                   Interventions Interventions: Breast feeding basics reviewed  Lactation Tools Discussed/Used Tools: Pump;Flanges Flange Size: 24;27 Breast pump type: Double-Electric Breast Pump Pump Review: Milk Storage   Consult Status Consult Status: Follow-up (LC offered to request and LC  O/P for next week and mom receptive) Date:  ([place] placed a request) Follow-up type: Out-patient    [place] 11/02/2019, 2:39 PM

## 2020-08-08 ENCOUNTER — Ambulatory Visit: Payer: BC Managed Care – PPO | Admitting: Emergency Medicine

## 2020-09-26 ENCOUNTER — Ambulatory Visit: Payer: BC Managed Care – PPO | Admitting: Emergency Medicine

## 2020-09-26 ENCOUNTER — Encounter: Payer: Self-pay | Admitting: Emergency Medicine

## 2020-09-26 ENCOUNTER — Other Ambulatory Visit: Payer: Self-pay

## 2020-09-26 VITALS — BP 142/80 | HR 76 | Temp 98.3°F | Ht 67.0 in | Wt 217.0 lb

## 2020-09-26 DIAGNOSIS — E6609 Other obesity due to excess calories: Secondary | ICD-10-CM

## 2020-09-26 DIAGNOSIS — Z6833 Body mass index (BMI) 33.0-33.9, adult: Secondary | ICD-10-CM

## 2020-09-26 DIAGNOSIS — Z7689 Persons encountering health services in other specified circumstances: Secondary | ICD-10-CM | POA: Diagnosis not present

## 2020-09-26 NOTE — Patient Instructions (Signed)
Health Maintenance, Female Adopting a healthy lifestyle and getting preventive care are important in promoting health and wellness. Ask your health care provider about: The right schedule for you to have regular tests and exams. Things you can do on your own to prevent diseases and keep yourself healthy. What should I know about diet, weight, and exercise? Eat a healthy diet  Eat a diet that includes plenty of vegetables, fruits, low-fat dairy products, and lean protein. Do not eat a lot of foods that are high in solid fats, added sugars, or sodium.  Maintain a healthy weight Body mass index (BMI) is used to identify weight problems. It estimates body fat based on height and weight. Your health care provider can help determineyour BMI and help you achieve or maintain a healthy weight. Get regular exercise Get regular exercise. This is one of the most important things you can do for your health. Most adults should: Exercise for at least 150 minutes each week. The exercise should increase your heart rate and make you sweat (moderate-intensity exercise). Do strengthening exercises at least twice a week. This is in addition to the moderate-intensity exercise. Spend less time sitting. Even light physical activity can be beneficial. Watch cholesterol and blood lipids Have your blood tested for lipids and cholesterol at 40 years of age, then havethis test every 5 years. Have your cholesterol levels checked more often if: Your lipid or cholesterol levels are high. You are older than 40 years of age. You are at high risk for heart disease. What should I know about cancer screening? Depending on your health history and family history, you may need to have cancer screening at various ages. This may include screening for: Breast cancer. Cervical cancer. Colorectal cancer. Skin cancer. Lung cancer. What should I know about heart disease, diabetes, and high blood pressure? Blood pressure and heart  disease High blood pressure causes heart disease and increases the risk of stroke. This is more likely to develop in people who have high blood pressure readings, are of African descent, or are overweight. Have your blood pressure checked: Every 3-5 years if you are 18-39 years of age. Every year if you are 40 years old or older. Diabetes Have regular diabetes screenings. This checks your fasting blood sugar level. Have the screening done: Once every three years after age 40 if you are at a normal weight and have a low risk for diabetes. More often and at a younger age if you are overweight or have a high risk for diabetes. What should I know about preventing infection? Hepatitis B If you have a higher risk for hepatitis B, you should be screened for this virus. Talk with your health care provider to find out if you are at risk forhepatitis B infection. Hepatitis C Testing is recommended for: Everyone born from 1945 through 1965. Anyone with known risk factors for hepatitis C. Sexually transmitted infections (STIs) Get screened for STIs, including gonorrhea and chlamydia, if: You are sexually active and are younger than 40 years of age. You are older than 40 years of age and your health care provider tells you that you are at risk for this type of infection. Your sexual activity has changed since you were last screened, and you are at increased risk for chlamydia or gonorrhea. Ask your health care provider if you are at risk. Ask your health care provider about whether you are at high risk for HIV. Your health care provider may recommend a prescription medicine to help   prevent HIV infection. If you choose to take medicine to prevent HIV, you should first get tested for HIV. You should then be tested every 3 months for as long as you are taking the medicine. Pregnancy If you are about to stop having your period (premenopausal) and you may become pregnant, seek counseling before you get  pregnant. Take 400 to 800 micrograms (mcg) of folic acid every day if you become pregnant. Ask for birth control (contraception) if you want to prevent pregnancy. Osteoporosis and menopause Osteoporosis is a disease in which the bones lose minerals and strength with aging. This can result in bone fractures. If you are 65 years old or older, or if you are at risk for osteoporosis and fractures, ask your health care provider if you should: Be screened for bone loss. Take a calcium or vitamin D supplement to lower your risk of fractures. Be given hormone replacement therapy (HRT) to treat symptoms of menopause. Follow these instructions at home: Lifestyle Do not use any products that contain nicotine or tobacco, such as cigarettes, e-cigarettes, and chewing tobacco. If you need help quitting, ask your health care provider. Do not use street drugs. Do not share needles. Ask your health care provider for help if you need support or information about quitting drugs. Alcohol use Do not drink alcohol if: Your health care provider tells you not to drink. You are pregnant, may be pregnant, or are planning to become pregnant. If you drink alcohol: Limit how much you use to 0-1 drink a day. Limit intake if you are breastfeeding. Be aware of how much alcohol is in your drink. In the [place] one drink equals one 12 oz bottle of beer (355 mL), one 5 oz glass of wine (148 mL), or one 1 oz glass of hard liquor (44 mL). General instructions Schedule regular health, dental, and eye exams. Stay current with your vaccines. Tell your health care provider if: You often feel depressed. You have ever been abused or do not feel safe at home. Summary Adopting a healthy lifestyle and getting preventive care are important in promoting health and wellness. Follow your health care provider's instructions about healthy diet, exercising, and getting tested or screened for diseases. Follow your health care provider's  instructions on monitoring your cholesterol and blood pressure. This information is not intended to replace advice given to you by your health care provider. Make sure you discuss any questions you have with your healthcare provider. Document Revised: 03/09/2018 Document Reviewed: 03/09/2018 Elsevier Patient Education  2022 [employer]  

## 2020-09-26 NOTE — Assessment & Plan Note (Signed)
Refer to nutritionist.  Diet and nutrition discussed.  Advised to decrease the amount of daily carbohydrate intake and increase amount of daily exercise i.e. walking.

## 2020-09-26 NOTE — Progress Notes (Signed)
Shannon Davenport 40 y.o.   Chief Complaint  Patient presents with   New Patient (Initial Visit)    Per patient she has a  knot on left side for years, just concerned    HISTORY OF PRESENT ILLNESS: This is a 40 y.o. female here to establish care with me. Concerned about her weight. No chronic medical problems.  No history of diabetes. 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  Breast-feeding. Has noticed lump on left side of abdomen for years. No other complaints or medical concerns today.  HPI   Prior to Admission medications   Medication Sig Start Date End Date Taking? Authorizing Provider  acetaminophen (TYLENOL) 500 MG tablet Take 2 tablets (1,000 mg total) by mouth every 6 (six) hours as needed. 11/01/19 10/31/20 Yes Juliene Pina, CNM  ibuprofen (ADVIL) 800 MG tablet Take 1 tablet (800 mg total) by mouth every 6 (six) hours. 11/01/19  Yes Juliene Pina, CNM  Prenatal Vit-Fe Fumarate-FA (PRENATAL MULTIVITAMIN) TABS tablet Take 1 tablet by mouth daily at 12 noon. 08/07/16  Yes Hoyt Koch, MD  valACYclovir (VALTREX) 500 MG tablet Take 500 mg by mouth as needed.   Yes [provider]  coconut oil OIL Apply 1 application topically as needed. Patient not taking: Reported on 09/26/2020 11/01/19   Juliene Pina, CNM  senna-docusate (SENOKOT-S) 8.6-50 MG tablet Take 2 tablets by mouth daily. Patient not taking: Reported on 09/26/2020 11/02/19   Juliene Pina, CNM  simethicone (MYLICON) 80 MG chewable tablet Chew 1 tablet (80 mg total) by mouth as needed for flatulence. Patient not taking: Reported on 09/26/2020 11/01/19   Juliene Pina, CNM    No Known Allergies  Patient Active Problem List   Diagnosis Date Noted   History of myomectomy 10/30/2019   Status post repeat low transverse cesarean section 8/2 10/30/2019   Postpartum care following cesarean delivery 8/2 10/30/2019   Previous cesarean delivery affecting pregnancy 10/30/2019   Advanced maternal age in  multigravida 11/27/2016   Asthma, mild persistent 02/02/2012    Past Medical History:  Diagnosis Date   Ectopic pregnancy without intrauterine pregnancy 12/13/2014   Ectopic pregnancy without intrauterine pregnancy/Laprascopic RT salpingectomy (9/15) 12/13/2014   Fibroid    GERD (gastroesophageal reflux disease)    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   History of pre-eclampsia in prior pregnancy, currently pregnant    Pregnancy induced hypertension     Past Surgical History:  Procedure Laterality Date   bunioectomy Right 2008   CESAREAN SECTION N/A 03/13/2017   Procedure: Primary CESAREAN SECTION;  Surgeon: Brien Few, MD;  Location: [facility];  Service: Obstetrics;  Laterality: N/A;  EDD: 05/08/17   CESAREAN SECTION N/A 10/30/2019   Procedure: Repeat CESAREAN SECTION;  Surgeon: Brien Few, MD;  Location: [facility];  Service: Obstetrics;  Laterality: N/A;  EDD: 11/20/19   LAPAROSCOPY N/A 12/13/2014   Procedure: LAPAROSCOPY OPERATIVE;  Surgeon: Brien Few, MD;  Location: [facility];  Service: Gynecology;  Laterality: N/A;   MYOMECTOMY N/A 03/07/2015   Procedure: Exploratory Laparotomy MYOMECTOMY;  Surgeon: Servando Salina, MD;  Location: [facility];  Service: Gynecology;  Laterality: N/A;  90 min.   UNILATERAL SALPINGECTOMY Right 12/13/2014   Procedure: UNILATERAL SALPINGECTOMY;  Surgeon: Brien Few, MD;  Location: [facility];  Service: Gynecology;  Laterality: Right;    Social History   Socioeconomic History   Marital status: Married    Spouse name: Not on file   Number of children: Not  on file   Years of education: Not on file   Highest education level: Not on file  Occupational History   Not on file  Tobacco Use   Smoking status: Former    Packs/day: 0.25    Years: 7.00    Pack years: 1.75    Types: Cigarettes   Smokeless tobacco: Never   Tobacco comments:    07/28/13 "3 puffs / MONTH"  Vaping Use   Vaping Use: Never used  Substance and Sexual Activity    Alcohol use: No    Alcohol/week: 5.0 standard drinks    Types: 5 Glasses of wine per week   Drug use: No   Sexual activity: Yes    Birth control/protection: None  Other Topics Concern   Not on file  Social History Narrative   Not on file   Social Determinants of Health   Financial Resource Strain: Not on file  Food Insecurity: Not on file  Transportation Needs: Not on file  Physical Activity: Not on file  Stress: Not on file  Social Connections: Not on file  Intimate Partner Violence: Not on file    Family History  Problem Relation Age of Onset   Heart disease Mother    Hyperlipidemia Mother    Hypertension Mother    Heart block Mother    Kidney cancer Father    Heart block Father    Breast cancer Maternal Grandmother      Review of Systems  Constitutional: Negative.  Negative for chills and fever.  HENT: Negative.  Negative for congestion and sore throat.   Respiratory: Negative.  Negative for cough and shortness of breath.   Cardiovascular: Negative.  Negative for chest pain and palpitations.  Gastrointestinal: Negative.  Negative for abdominal pain, diarrhea, nausea and vomiting.  Genitourinary: Negative.  Negative for dysuria and hematuria.  Musculoskeletal:  Negative for back pain, myalgias and neck pain.  Skin: Negative.  Negative for rash.  Neurological: Negative.  Negative for dizziness and headaches.  All other systems reviewed and are negative.  Today's Vitals   09/26/20 0929  BP: (!) 142/80  Pulse: 76  Temp: 98.3 F (36.8 C)  TempSrc: Oral  SpO2: 98%  Weight: 217 lb (98.4 kg)  Height: 5\' 7"  (1.702 m)   Body mass index is 33.99 kg/m. Wt Readings from Last 3 Encounters:  09/26/20 217 lb (98.4 kg)  10/30/19 220 lb 0.3 oz (99.8 kg)  10/17/19 220 lb (99.8 kg)    Physical Exam Vitals reviewed.  Constitutional:      Appearance: Normal appearance.  HENT:     Head: Normocephalic.  Eyes:     Extraocular Movements: Extraocular movements intact.      Conjunctiva/sclera: Conjunctivae normal.     Pupils: Pupils are equal, round, and reactive to light.  Cardiovascular:     Rate and Rhythm: Normal rate and regular rhythm.     Pulses: Normal pulses.     Heart sounds: Normal heart sounds.  Pulmonary:     Effort: Pulmonary effort is normal.     Breath sounds: Normal breath sounds.  Abdominal:     Palpations: Abdomen is soft. There is no mass.     Tenderness: There is no abdominal tenderness.  Musculoskeletal:        General: Normal range of motion.     Cervical back: Normal range of motion and neck supple.  Skin:    General: Skin is warm and dry.     Capillary Refill: Capillary refill takes  less than 2 seconds.     Comments: No abnormal palpable masses on area of concern, left abdomen and flank area  Neurological:     General: No focal deficit present.     Mental Status: She is alert and oriented to person, place, and time.  Psychiatric:        Mood and Affect: Mood normal.        Behavior: Behavior normal.     ASSESSMENT & PLAN: A total of 30 minutes was spent with the patient and counseling/coordination of care regarding preparing for this visit, review of previous office visit notes, establishing care with me, review of past medical history, weight management strategies, referral to nutritionist, education on nutrition and need to decrease amount of daily carbohydrate intake, health maintenance items, prognosis, documentation and need for follow-up.  Class 1 obesity due to excess calories without serious comorbidity with body mass index (BMI) of 33.0 to 33.9 in adult Refer to nutritionist.  Diet and nutrition discussed.  Advised to decrease the amount of daily carbohydrate intake and increase amount of daily exercise i.e. walking.   Evita was seen today for new patient (initial visit).  Diagnoses and all orders for this visit:  Class 1 obesity due to excess calories without serious comorbidity with body mass index (BMI)  of 33.0 to 33.9 in adult -     Amb ref to Medical Nutrition Therapy-MNT  Encounter to establish care  Body mass index (BMI) of 33.0-33.9 in adult -     Amb ref to Medical Nutrition Therapy-MNT  Patient Instructions  Health Maintenance, Female Adopting a healthy lifestyle and getting preventive care are important in promoting health and wellness. Ask your health care provider about: The right schedule for you to have regular tests and exams. Things you can do on your own to prevent diseases and keep yourself healthy. What should I know about diet, weight, and exercise? Eat a healthy diet  Eat a diet that includes plenty of vegetables, fruits, low-fat dairy products, and lean protein. Do not eat a lot of foods that are high in solid fats, added sugars, or sodium.  Maintain a healthy weight Body mass index (BMI) is used to identify weight problems. It estimates body fat based on height and weight. Your health care provider can help determineyour BMI and help you achieve or maintain a healthy weight. Get regular exercise Get regular exercise. This is one of the most important things you can do for your health. Most adults should: Exercise for at least 150 minutes each week. The exercise should increase your heart rate and make you sweat (moderate-intensity exercise). Do strengthening exercises at least twice a week. This is in addition to the moderate-intensity exercise. Spend less time sitting. Even light physical activity can be beneficial. Watch cholesterol and blood lipids Have your blood tested for lipids and cholesterol at 40 years of age, then havethis test every 5 years. Have your cholesterol levels checked more often if: Your lipid or cholesterol levels are high. You are older than 40 years of age. You are at high risk for heart disease. What should I know about cancer screening? Depending on your health history and family history, you may need to have cancer screening at  various ages. This may include screening for: Breast cancer. Cervical cancer. Colorectal cancer. Skin cancer. Lung cancer. What should I know about heart disease, diabetes, and high blood pressure? Blood pressure and heart disease High blood pressure causes heart disease and increases the  risk of stroke. This is more likely to develop in people who have high blood pressure readings, are of African descent, or are overweight. Have your blood pressure checked: Every 3-5 years if you are 94-22 years of age. Every year if you are 70 years old or older. Diabetes Have regular diabetes screenings. This checks your fasting blood sugar level. Have the screening done: Once every three years after age 16 if you are at a normal weight and have a low risk for diabetes. More often and at a younger age if you are overweight or have a high risk for diabetes. What should I know about preventing infection? Hepatitis B If you have a higher risk for hepatitis B, you should be screened for this virus. Talk with your health care provider to find out if you are at risk forhepatitis B infection. Hepatitis C Testing is recommended for: Everyone born from [birth year] through 1965. Anyone with known risk factors for hepatitis C. Sexually transmitted infections (STIs) Get screened for STIs, including gonorrhea and chlamydia, if: You are sexually active and are younger than 40 years of age. You are older than 40 years of age and your health care provider tells you that you are at risk for this type of infection. Your sexual activity has changed since you were last screened, and you are at increased risk for chlamydia or gonorrhea. Ask your health care provider if you are at risk. Ask your health care provider about whether you are at high risk for HIV. Your health care provider may recommend a prescription medicine to help prevent HIV infection. If you choose to take medicine to prevent HIV, you should first get tested for  HIV. You should then be tested every 3 months for as long as you are taking the medicine. Pregnancy If you are about to stop having your period (premenopausal) and you may become pregnant, seek counseling before you get pregnant. Take 400 to 800 micrograms (mcg) of folic acid every day if you become pregnant. Ask for birth control (contraception) if you want to prevent pregnancy. Osteoporosis and menopause Osteoporosis is a disease in which the bones lose minerals and strength with aging. This can result in bone fractures. If you are 73 years old or older, or if you are at risk for osteoporosis and fractures, ask your health care provider if you should: Be screened for bone loss. Take a calcium or vitamin D supplement to lower your risk of fractures. Be given hormone replacement therapy (HRT) to treat symptoms of menopause. Follow these instructions at home: Lifestyle Do not use any products that contain nicotine or tobacco, such as cigarettes, e-cigarettes, and chewing tobacco. If you need help quitting, ask your health care provider. Do not use street drugs. Do not share needles. Ask your health care provider for help if you need support or information about quitting drugs. Alcohol use Do not drink alcohol if: Your health care provider tells you not to drink. You are pregnant, may be pregnant, or are planning to become pregnant. If you drink alcohol: Limit how much you use to 0-1 drink a day. Limit intake if you are breastfeeding. Be aware of how much alcohol is in your drink. In the [place] one drink equals one 12 oz bottle of beer (355 mL), one 5 oz glass of wine (148 mL), or one 1 oz glass of hard liquor (44 mL). General instructions Schedule regular health, dental, and eye exams. Stay current with your vaccines. Tell your health care  provider if: You often feel depressed. You have ever been abused or do not feel safe at home. Summary Adopting a healthy lifestyle and getting  preventive care are important in promoting health and wellness. Follow your health care provider's instructions about healthy diet, exercising, and getting tested or screened for diseases. Follow your health care provider's instructions on monitoring your cholesterol and blood pressure. This information is not intended to replace advice given to you by your health care provider. Make sure you discuss any questions you have with your healthcare provider. Document Revised: 03/09/2018 Document Reviewed: 03/09/2018 Elsevier Patient Education  2022 [place], MD [facility]

## 2021-04-01 ENCOUNTER — Ambulatory Visit: Payer: BC Managed Care – PPO | Admitting: Emergency Medicine

## 2022-09-22 ENCOUNTER — Ambulatory Visit: Payer: BC Managed Care – PPO | Admitting: Emergency Medicine

## 2022-09-22 VITALS — BP 118/70 | HR 77 | Temp 98.1°F | Ht 67.0 in | Wt 214.1 lb

## 2022-09-22 DIAGNOSIS — Z6833 Body mass index (BMI) 33.0-33.9, adult: Secondary | ICD-10-CM

## 2022-09-22 DIAGNOSIS — E6609 Other obesity due to excess calories: Secondary | ICD-10-CM | POA: Diagnosis not present

## 2022-09-22 DIAGNOSIS — Z8619 Personal history of other infectious and parasitic diseases: Secondary | ICD-10-CM

## 2022-09-22 DIAGNOSIS — M79672 Pain in left foot: Secondary | ICD-10-CM | POA: Insufficient documentation

## 2022-09-22 MED ORDER — VALACYCLOVIR HCL 500 MG PO TABS: 500.0000 mg | ORAL_TABLET | ORAL | 3 refills | Status: DC | PRN

## 2022-09-22 MED ORDER — IBUPROFEN 800 MG PO TABS: 800.0000 mg | ORAL_TABLET | Freq: Four times a day (QID) | ORAL | 0 refills | Status: DC

## 2022-09-22 NOTE — Assessment & Plan Note (Signed)
Diet and nutrition discussed Benefits of exercise discussed Advised to decrease amount of daily carbohydrate intake and daily calories and increase amount of plant-based protein in her diet Blood work done today

## 2022-09-22 NOTE — Patient Instructions (Signed)

## 2022-09-22 NOTE — Assessment & Plan Note (Signed)
May benefit from valacyclovir at the onset of lesions. New prescription sent today to pharmacy of record

## 2022-09-22 NOTE — Progress Notes (Signed)
Shannon Davenport 42 y.o.   Chief Complaint  Patient presents with   Medical Management of Chronic Issues    F.u appt, medication refill, pt is having some foot pain, bottom of your left foot x 2 months , having some feet swelling     HISTORY OF PRESENT ILLNESS: This is a 42 y.o. female complaining of pain and swelling to left foot for 2 months.  Better today. Also concerned about her weight. History of chronic cold sores.  Requesting prescription for valacyclovir. No other complaints or medical concerns today. Wt Readings from Last 3 Encounters:  09/22/22 214 lb 2 oz (97.1 kg)  09/26/20 217 lb (98.4 kg)  10/30/19 220 lb 0.3 oz (99.8 kg)     HPI   Prior to Admission medications   Medication Sig Start Date End Date Taking? Authorizing Provider  ibuprofen (ADVIL) 800 MG tablet Take 1 tablet (800 mg total) by mouth every 6 (six) hours. 11/01/19  Yes Neta Mends, CNM  valACYclovir (VALTREX) 500 MG tablet Take 500 mg by mouth as needed.   Yes [provider]  coconut oil OIL Apply 1 application topically as needed. Patient not taking: Reported on 09/26/2020 11/01/19   Neta Mends, CNM  Prenatal Vit-Fe Fumarate-FA (PRENATAL MULTIVITAMIN) TABS tablet Take 1 tablet by mouth daily at 12 noon. Patient not taking: Reported on 09/22/2022 08/07/16   Myrlene Broker, MD  senna-docusate (SENOKOT-S) 8.6-50 MG tablet Take 2 tablets by mouth daily. Patient not taking: Reported on 09/26/2020 11/02/19   Neta Mends, CNM  simethicone (MYLICON) 80 MG chewable tablet Chew 1 tablet (80 mg total) by mouth as needed for flatulence. Patient not taking: Reported on 09/26/2020 11/01/19   Neta Mends, CNM    No Known Allergies  Patient Active Problem List   Diagnosis Date Noted   Class 1 obesity due to excess calories without serious comorbidity with body mass index (BMI) of 33.0 to 33.9 in adult 09/26/2020   Body mass index (BMI) of 33.0-33.9 in adult 09/26/2020   History of  myomectomy 10/30/2019   Asthma, mild persistent 02/02/2012    Past Medical History:  Diagnosis Date   Ectopic pregnancy without intrauterine pregnancy 12/13/2014   Ectopic pregnancy without intrauterine pregnancy/Laprascopic RT salpingectomy (9/15) 12/13/2014   Fibroid    GERD (gastroesophageal reflux disease)    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   History of pre-eclampsia in prior pregnancy, currently pregnant    Pregnancy induced hypertension     Past Surgical History:  Procedure Laterality Date   bunioectomy Right 2008   CESAREAN SECTION N/A 03/13/2017   Procedure: Primary CESAREAN SECTION;  Surgeon: Olivia Mackie, MD;  Location: [facility];  Service: Obstetrics;  Laterality: N/A;  EDD: 05/08/17   CESAREAN SECTION N/A 10/30/2019   Procedure: Repeat CESAREAN SECTION;  Surgeon: Olivia Mackie, MD;  Location: [facility];  Service: Obstetrics;  Laterality: N/A;  EDD: 11/20/19   LAPAROSCOPY N/A 12/13/2014   Procedure: LAPAROSCOPY OPERATIVE;  Surgeon: Olivia Mackie, MD;  Location: [facility];  Service: Gynecology;  Laterality: N/A;   MYOMECTOMY N/A 03/07/2015   Procedure: Exploratory Laparotomy MYOMECTOMY;  Surgeon: Maxie Better, MD;  Location: [facility];  Service: Gynecology;  Laterality: N/A;  90 min.   UNILATERAL SALPINGECTOMY Right 12/13/2014   Procedure: UNILATERAL SALPINGECTOMY;  Surgeon: Olivia Mackie, MD;  Location: [facility];  Service: Gynecology;  Laterality: Right;    Social History   Socioeconomic History   Marital status: Married  Spouse name: Not on file   Number of children: Not on file   Years of education: Not on file   Highest education level: Master's degree (e.g., MA, MS, MEng, MEd, MSW, MBA)  Occupational History   Not on file  Tobacco Use   Smoking status: Former    Packs/day: 0.25    Years: 7.00    Additional pack years: 0.00    Total pack years: 1.75    Types: Cigarettes   Smokeless tobacco: Never   Tobacco comments:    07/28/13 "3 puffs / MONTH"   Vaping Use   Vaping Use: Never used  Substance and Sexual Activity   Alcohol use: No    Alcohol/week: 5.0 standard drinks of alcohol    Types: 5 Glasses of wine per week   Drug use: No   Sexual activity: Yes    Birth control/protection: None  Other Topics Concern   Not on file  Social History Narrative   Not on file   Social Determinants of Health   Financial Resource Strain: Low Risk  (09/22/2022)   Overall Financial Resource Strain (CARDIA)    Difficulty of Paying Living Expenses: Not very hard  Food Insecurity: No Food Insecurity (09/22/2022)   Hunger Vital Sign    Worried About Running Out of Food in the Last Year: Never true    Ran Out of Food in the Last Year: Never true  Transportation Needs: No Transportation Needs (09/22/2022)   PRAPARE - Administrator, Civil Service (Medical): No    Lack of Transportation (Non-Medical): No  Physical Activity: Insufficiently Active (09/22/2022)   Exercise Vital Sign    Days of Exercise per Week: 2 days    Minutes of Exercise per Session: 30 min  Stress: No Stress Concern Present (09/22/2022)   [employer] of Occupational Health - Occupational Stress Questionnaire    Feeling of Stress : Not at all  Social Connections: Socially Integrated (09/22/2022)   Social Connection and Isolation Panel [NHANES]    Frequency of Communication with Friends and Family: Three times a week    Frequency of Social Gatherings with Friends and Family: Once a week    Attends Religious Services: More than 4 times per year    Active Member of [employer] or Organizations: Yes    Attends Engineer, structural: More than 4 times per year    Marital Status: Married  Catering manager Violence: Not on file    Family History  Problem Relation Age of Onset   Heart disease Mother    Hyperlipidemia Mother    Hypertension Mother    Heart block Mother    Kidney cancer Father    Heart block Father    Breast cancer Maternal Grandmother       Review of Systems  Constitutional: Negative.  Negative for chills and fever.  HENT: Negative.  Negative for congestion and sore throat.   Respiratory: Negative.  Negative for cough and shortness of breath.   Cardiovascular: Negative.  Negative for chest pain and palpitations.  Gastrointestinal:  Negative for abdominal pain, diarrhea, nausea and vomiting.  Genitourinary: Negative.  Negative for dysuria and hematuria.  Skin: Negative.  Negative for rash.  Neurological: Negative.  Negative for dizziness and headaches.  All other systems reviewed and are negative.   Today's Vitals   09/22/22 0946  BP: 118/70  Pulse: 77  Temp: 98.1 F (36.7 C)  TempSrc: Oral  SpO2: 98%  Weight: 214 lb 2 oz (97.1  kg)  Height: 5\' 7"  (1.702 m)   Body mass index is 33.54 kg/m.   Physical Exam Vitals reviewed.  Constitutional:      Appearance: Normal appearance.  HENT:     Head: Normocephalic.     Mouth/Throat:     Mouth: Mucous membranes are moist.     Pharynx: Oropharynx is clear.  Eyes:     Extraocular Movements: Extraocular movements intact.     Pupils: Pupils are equal, round, and reactive to light.  Cardiovascular:     Rate and Rhythm: Normal rate and regular rhythm.     Pulses: Normal pulses.     Heart sounds: Normal heart sounds.  Pulmonary:     Effort: Pulmonary effort is normal.     Breath sounds: Normal breath sounds.  Musculoskeletal:     Cervical back: No tenderness.     Comments: Feet: Warm to touch.  Excellent distal circulation.  Excellent capillary refill.  No skin changes or ecchymosis.  No tenderness or swelling.  Normal exam.  Lymphadenopathy:     Cervical: No cervical adenopathy.  Skin:    General: Skin is warm and dry.     Capillary Refill: Capillary refill takes less than 2 seconds.  Neurological:     General: No focal deficit present.     Mental Status: She is alert and oriented to person, place, and time.  Psychiatric:        Mood and Affect: Mood  normal.        Behavior: Behavior normal.      ASSESSMENT & PLAN: A total of 42 minutes was spent with the patient and counseling/coordination of care regarding preparing for this visit, review of most recent office visit notes, review of chronic medical problems, review of all medications, education on nutrition, need for blood work today, prognosis, documentation, and need for follow-up.  Problem List Items Addressed This Visit       Other   Class 1 obesity due to excess calories without serious comorbidity with body mass index (BMI) of 33.0 to 33.9 in adult - Primary    Diet and nutrition discussed Benefits of exercise discussed Advised to decrease amount of daily carbohydrate intake and daily calories and increase amount of plant-based protein in her diet Blood work done today      Relevant Orders   Comprehensive metabolic panel   CBC with Differential/Platelet   Hemoglobin A1c   Lipid panel   TSH   Left foot pain    Benign examination Differential diagnosis discussed Slowly getting better      History of herpes simplex infection    May benefit from valacyclovir at the onset of lesions. New prescription sent today to pharmacy of record      Relevant Medications   valACYclovir (VALTREX) 500 MG tablet   Patient Instructions  Health Maintenance, Female Adopting a healthy lifestyle and getting preventive care are important in promoting health and wellness. Ask your health care provider about: The right schedule for you to have regular tests and exams. Things you can do on your own to prevent diseases and keep yourself healthy. What should I know about diet, weight, and exercise? Eat a healthy diet  Eat a diet that includes plenty of vegetables, fruits, low-fat dairy products, and lean protein. Do not eat a lot of foods that are high in solid fats, added sugars, or sodium. Maintain a healthy weight Body mass index (BMI) is used to identify weight problems. It  estimates body fat  based on height and weight. Your health care provider can help determine your BMI and help you achieve or maintain a healthy weight. Get regular exercise Get regular exercise. This is one of the most important things you can do for your health. Most adults should: Exercise for at least 150 minutes each week. The exercise should increase your heart rate and make you sweat (moderate-intensity exercise). Do strengthening exercises at least twice a week. This is in addition to the moderate-intensity exercise. Spend less time sitting. Even light physical activity can be beneficial. Watch cholesterol and blood lipids Have your blood tested for lipids and cholesterol at 42 years of age, then have this test every 5 years. Have your cholesterol levels checked more often if: Your lipid or cholesterol levels are high. You are older than 42 years of age. You are at high risk for heart disease. What should I know about cancer screening? Depending on your health history and family history, you may need to have cancer screening at various ages. This may include screening for: Breast cancer. Cervical cancer. Colorectal cancer. Skin cancer. Lung cancer. What should I know about heart disease, diabetes, and high blood pressure? Blood pressure and heart disease High blood pressure causes heart disease and increases the risk of stroke. This is more likely to develop in people who have high blood pressure readings or are overweight. Have your blood pressure checked: Every 3-5 years if you are 30-54 years of age. Every year if you are 80 years old or older. Diabetes Have regular diabetes screenings. This checks your fasting blood sugar level. Have the screening done: Once every three years after age 40 if you are at a normal weight and have a low risk for diabetes. More often and at a younger age if you are overweight or have a high risk for diabetes. What should I know about preventing  infection? Hepatitis B If you have a higher risk for hepatitis B, you should be screened for this virus. Talk with your health care provider to find out if you are at risk for hepatitis B infection. Hepatitis C Testing is recommended for: Everyone born from [birth year] through 1965. Anyone with known risk factors for hepatitis C. Sexually transmitted infections (STIs) Get screened for STIs, including gonorrhea and chlamydia, if: You are sexually active and are younger than 42 years of age. You are older than 42 years of age and your health care provider tells you that you are at risk for this type of infection. Your sexual activity has changed since you were last screened, and you are at increased risk for chlamydia or gonorrhea. Ask your health care provider if you are at risk. Ask your health care provider about whether you are at high risk for HIV. Your health care provider may recommend a prescription medicine to help prevent HIV infection. If you choose to take medicine to prevent HIV, you should first get tested for HIV. You should then be tested every 3 months for as long as you are taking the medicine. Pregnancy If you are about to stop having your period (premenopausal) and you may become pregnant, seek counseling before you get pregnant. Take 400 to 800 micrograms (mcg) of folic acid every day if you become pregnant. Ask for birth control (contraception) if you want to prevent pregnancy. Osteoporosis and menopause Osteoporosis is a disease in which the bones lose minerals and strength with aging. This can result in bone fractures. If you are 50 years old or older,  or if you are at risk for osteoporosis and fractures, ask your health care provider if you should: Be screened for bone loss. Take a calcium or vitamin D supplement to lower your risk of fractures. Be given hormone replacement therapy (HRT) to treat symptoms of menopause. Follow these instructions at home: Alcohol use Do not  drink alcohol if: Your health care provider tells you not to drink. You are pregnant, may be pregnant, or are planning to become pregnant. If you drink alcohol: Limit how much you have to: 0-1 drink a day. Know how much alcohol is in your drink. In the [place] one drink equals one 12 oz bottle of beer (355 mL), one 5 oz glass of wine (148 mL), or one 1 oz glass of hard liquor (44 mL). Lifestyle Do not use any products that contain nicotine or tobacco. These products include cigarettes, chewing tobacco, and vaping devices, such as e-cigarettes. If you need help quitting, ask your health care provider. Do not use street drugs. Do not share needles. Ask your health care provider for help if you need support or information about quitting drugs. General instructions Schedule regular health, dental, and eye exams. Stay current with your vaccines. Tell your health care provider if: You often feel depressed. You have ever been abused or do not feel safe at home. Summary Adopting a healthy lifestyle and getting preventive care are important in promoting health and wellness. Follow your health care provider's instructions about healthy diet, exercising, and getting tested or screened for diseases. Follow your health care provider's instructions on monitoring your cholesterol and blood pressure. This information is not intended to replace advice given to you by your health care provider. Make sure you discuss any questions you have with your health care provider. Document Revised: 08/05/2020 Document Reviewed: 08/05/2020 Elsevier Patient Education  2024 Else[employer]    Edwina Barth, MD [facility]

## 2022-09-22 NOTE — Assessment & Plan Note (Signed)
Benign examination Differential diagnosis discussed Slowly getting better

## 2023-01-11 ENCOUNTER — Encounter: Payer: Self-pay | Admitting: Emergency Medicine

## 2023-01-11 ENCOUNTER — Ambulatory Visit: Payer: BC Managed Care – PPO | Admitting: Emergency Medicine

## 2023-01-11 VITALS — BP 138/88 | HR 84 | Temp 98.3°F | Wt 218.6 lb

## 2023-01-11 DIAGNOSIS — E66811 Obesity, class 1: Secondary | ICD-10-CM

## 2023-01-11 DIAGNOSIS — E6609 Other obesity due to excess calories: Secondary | ICD-10-CM

## 2023-01-11 DIAGNOSIS — Z8619 Personal history of other infectious and parasitic diseases: Secondary | ICD-10-CM | POA: Diagnosis not present

## 2023-01-11 DIAGNOSIS — Z6833 Body mass index (BMI) 33.0-33.9, adult: Secondary | ICD-10-CM

## 2023-01-11 LAB — COMPREHENSIVE METABOLIC PANEL
ALT: 11 U/L (ref 0–35)
AST: 14 U/L (ref 0–37)
Albumin: 4 g/dL (ref 3.5–5.2)
Alkaline Phosphatase: 55 U/L (ref 39–117)
BUN: 12 mg/dL (ref 6–23)
CO2: 27 meq/L (ref 19–32)
Calcium: 9.5 mg/dL (ref 8.4–10.5)
Chloride: 102 meq/L (ref 96–112)
Creatinine, Ser: 0.76 mg/dL (ref 0.40–1.20)
GFR: 96.72 mL/min (ref >60.00)
Glucose, Bld: 85 mg/dL (ref 70–99)
Potassium: 4 meq/L (ref 3.5–5.1)
Sodium: 135 meq/L (ref 135–145)
Total Bilirubin: 0.3 mg/dL (ref 0.2–1.2)
Total Protein: 7.2 g/dL (ref 6.0–8.3)

## 2023-01-11 LAB — CBC WITH DIFFERENTIAL/PLATELET
Basophils Absolute: 0 10*3/uL (ref 0.0–0.1)
Basophils Relative: 0.4 % (ref 0.0–3.0)
Eosinophils Absolute: 0.1 10*3/uL (ref 0.0–0.7)
Eosinophils Relative: 2.5 % (ref 0.0–5.0)
HCT: 39.4 % (ref 36.0–46.0)
Hemoglobin: 12.7 g/dL (ref 12.0–15.0)
Lymphocytes Relative: 45.1 % (ref 12.0–46.0)
Lymphs Abs: 2.4 10*3/uL (ref 0.7–4.0)
MCHC: 32.3 g/dL (ref 30.0–36.0)
MCV: 89.9 fL (ref 78.0–100.0)
Monocytes Absolute: 0.6 10*3/uL (ref 0.1–1.0)
Monocytes Relative: 11.4 % (ref 3.0–12.0)
Neutro Abs: 2.2 10*3/uL (ref 1.4–7.7)
Neutrophils Relative %: 40.6 % — ABNORMAL LOW (ref 43.0–77.0)
Platelets: 231 10*3/uL (ref 150.0–400.0)
RBC: 4.38 Mil/uL (ref 3.87–5.11)
RDW: 14 % (ref 11.5–15.5)
WBC: 5.4 10*3/uL (ref 4.0–10.5)

## 2023-01-11 LAB — LIPID PANEL
Cholesterol: 182 mg/dL (ref 0–200)
HDL: 46.6 mg/dL (ref >39.00)
LDL Cholesterol: 110 mg/dL — ABNORMAL HIGH (ref 0–99)
NonHDL: 135.49
Total CHOL/HDL Ratio: 4
Triglycerides: 128 mg/dL (ref 0.0–149.0)
VLDL: 25.6 mg/dL (ref 0.0–40.0)

## 2023-01-11 LAB — HEMOGLOBIN A1C: Hgb A1c MFr Bld: 5.9 % (ref 4.6–6.5)

## 2023-01-11 LAB — TSH: TSH: 1.43 u[IU]/mL (ref 0.35–5.50)

## 2023-01-11 MED ORDER — VALACYCLOVIR HCL 500 MG PO TABS: 500.0000 mg | ORAL_TABLET | ORAL | 3 refills | Status: AC | PRN

## 2023-01-11 MED ORDER — IBUPROFEN 800 MG PO TABS: 800.0000 mg | ORAL_TABLET | Freq: Four times a day (QID) | ORAL | 0 refills | Status: AC

## 2023-01-11 NOTE — Progress Notes (Signed)
Shannon Davenport 42 y.o.   Chief Complaint  Patient presents with   Follow-up    3 Month Follow Up, no concerns    HISTORY OF PRESENT ILLNESS: This is a 42 y.o. female here for 1-month follow-up of chronic medical conditions Overall doing well.  Has no complaints or medical concerns today. Wt Readings from Last 3 Encounters:  01/11/23 218 lb 9.6 oz (99.2 kg)  09/22/22 214 lb 2 oz (97.1 kg)  09/26/20 217 lb (98.4 kg)     HPI   Prior to Admission medications   Medication Sig Start Date End Date Taking? Authorizing Provider  ibuprofen (ADVIL) 800 MG tablet Take 1 tablet (800 mg total) by mouth every 6 (six) hours. 09/22/22  Yes Judah Carchi, Eilleen Kempf, MD  valACYclovir (VALTREX) 500 MG tablet Take 1 tablet (500 mg total) by mouth as needed. 09/22/22  Yes Caress Reffitt, Eilleen Kempf, MD  coconut oil OIL Apply 1 application topically as needed. Patient not taking: Reported on 09/26/2020 11/01/19   Neta Mends, CNM  Prenatal Vit-Fe Fumarate-FA (PRENATAL MULTIVITAMIN) TABS tablet Take 1 tablet by mouth daily at 12 noon. Patient not taking: Reported on 09/22/2022 08/07/16   Myrlene Broker, MD  senna-docusate (SENOKOT-S) 8.6-50 MG tablet Take 2 tablets by mouth daily. Patient not taking: Reported on 09/26/2020 11/02/19   Neta Mends, CNM  simethicone (MYLICON) 80 MG chewable tablet Chew 1 tablet (80 mg total) by mouth as needed for flatulence. Patient not taking: Reported on 09/26/2020 11/01/19   Neta Mends, CNM    No Known Allergies  Patient Active Problem List   Diagnosis Date Noted   Left foot pain 09/22/2022   History of herpes simplex infection 09/22/2022   Class 1 obesity due to excess calories without serious comorbidity with body mass index (BMI) of 33.0 to 33.9 in adult 09/26/2020   Body mass index (BMI) of 33.0-33.9 in adult 09/26/2020   History of myomectomy 10/30/2019   Asthma, mild persistent 02/02/2012    Past Medical History:  Diagnosis Date   Ectopic  pregnancy without intrauterine pregnancy 12/13/2014   Ectopic pregnancy without intrauterine pregnancy/Laprascopic RT salpingectomy (9/15) 12/13/2014   Fibroid    GERD (gastroesophageal reflux disease)    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   History of pre-eclampsia in prior pregnancy, currently pregnant    Pregnancy induced hypertension     Past Surgical History:  Procedure Laterality Date   bunioectomy Right 2008   CESAREAN SECTION N/A 03/13/2017   Procedure: Primary CESAREAN SECTION;  Surgeon: Olivia Mackie, MD;  Location: [facility];  Service: Obstetrics;  Laterality: N/A;  EDD: 05/08/17   CESAREAN SECTION N/A 10/30/2019   Procedure: Repeat CESAREAN SECTION;  Surgeon: Olivia Mackie, MD;  Location: [facility];  Service: Obstetrics;  Laterality: N/A;  EDD: 11/20/19   LAPAROSCOPY N/A 12/13/2014   Procedure: LAPAROSCOPY OPERATIVE;  Surgeon: Olivia Mackie, MD;  Location: [facility];  Service: Gynecology;  Laterality: N/A;   MYOMECTOMY N/A 03/07/2015   Procedure: Exploratory Laparotomy MYOMECTOMY;  Surgeon: Maxie Better, MD;  Location: [facility];  Service: Gynecology;  Laterality: N/A;  90 min.   UNILATERAL SALPINGECTOMY Right 12/13/2014   Procedure: UNILATERAL SALPINGECTOMY;  Surgeon: Olivia Mackie, MD;  Location: [facility];  Service: Gynecology;  Laterality: Right;    Social History   Socioeconomic History   Marital status: Married    Spouse name: Not on file   Number of children: Not on file   Years of education: Not on file  Highest education level: Master's degree (e.g., MA, MS, MEng, MEd, MSW, MBA)  Occupational History   Not on file  Tobacco Use   Smoking status: Former    Current packs/day: 0.25    Average packs/day: 0.3 packs/day for 7.0 years (1.8 ttl pk-yrs)    Types: Cigarettes   Smokeless tobacco: Never   Tobacco comments:    07/28/13 "3 puffs / MONTH"  Vaping Use   Vaping status: Never Used  Substance and Sexual Activity   Alcohol use: No    Alcohol/week: 5.0  standard drinks of alcohol    Types: 5 Glasses of wine per week   Drug use: No   Sexual activity: Yes    Birth control/protection: None  Other Topics Concern   Not on file  Social History Narrative   Not on file   Social Determinants of Health   Financial Resource Strain: Low Risk  (09/22/2022)   Overall Financial Resource Strain (CARDIA)    Difficulty of Paying Living Expenses: Not very hard  Food Insecurity: No Food Insecurity (09/22/2022)   Hunger Vital Sign    Worried About Running Out of Food in the Last Year: Never true    Ran Out of Food in the Last Year: Never true  Transportation Needs: No Transportation Needs (09/22/2022)   PRAPARE - Administrator, Civil Service (Medical): No    Lack of Transportation (Non-Medical): No  Physical Activity: Insufficiently Active (09/22/2022)   Exercise Vital Sign    Days of Exercise per Week: 2 days    Minutes of Exercise per Session: 30 min  Stress: No Stress Concern Present (09/22/2022)   [employer] of Occupational Health - Occupational Stress Questionnaire    Feeling of Stress : Not at all  Social Connections: Socially Integrated (09/22/2022)   Social Connection and Isolation Panel [NHANES]    Frequency of Communication with Friends and Family: Three times a week    Frequency of Social Gatherings with Friends and Family: Once a week    Attends Religious Services: More than 4 times per year    Active Member of [employer] or Organizations: Yes    Attends Engineer, structural: More than 4 times per year    Marital Status: Married  Catering manager Violence: Not on file    Family History  Problem Relation Age of Onset   Heart disease Mother    Hyperlipidemia Mother    Hypertension Mother    Heart block Mother    Kidney cancer Father    Heart block Father    Breast cancer Maternal Grandmother      Review of Systems  Constitutional: Negative.  Negative for chills and fever.  HENT: Negative.  Negative for  congestion and sore throat.   Respiratory: Negative.  Negative for cough and shortness of breath.   Cardiovascular: Negative.  Negative for chest pain and palpitations.  Gastrointestinal:  Negative for abdominal pain, diarrhea, nausea and vomiting.  Genitourinary: Negative.  Negative for dysuria and hematuria.  Skin: Negative.  Negative for rash.  Neurological: Negative.  Negative for dizziness and headaches.  All other systems reviewed and are negative.   Vitals:   01/11/23 1422  BP: 138/88  Pulse: 84  Temp: 98.3 F (36.8 C)  SpO2: 97%    Physical Exam Vitals reviewed.  Constitutional:      Appearance: Normal appearance.  HENT:     Head: Normocephalic.     Mouth/Throat:     Mouth: Mucous membranes are moist.  Pharynx: Oropharynx is clear.  Eyes:     Extraocular Movements: Extraocular movements intact.     Pupils: Pupils are equal, round, and reactive to light.  Cardiovascular:     Rate and Rhythm: Normal rate and regular rhythm.     Pulses: Normal pulses.     Heart sounds: Normal heart sounds.  Pulmonary:     Effort: Pulmonary effort is normal.     Breath sounds: Normal breath sounds.  Musculoskeletal:     Cervical back: No tenderness.  Lymphadenopathy:     Cervical: No cervical adenopathy.  Skin:    General: Skin is warm and dry.     Capillary Refill: Capillary refill takes less than 2 seconds.  Neurological:     General: No focal deficit present.     Mental Status: She is alert and oriented to person, place, and time.  Psychiatric:        Mood and Affect: Mood normal.        Behavior: Behavior normal.      ASSESSMENT & PLAN: A total of 42 minutes was spent with the patient and counseling/coordination of care regarding preparing for this visit, review of most recent office visit notes, review of past medical history, review of all medications, education and nutrition and need to decrease amount of daily carbohydrate intake and daily calories and increase  amount of plant based protein in her diet, benefits of exercise, need for blood work today, prognosis, documentation and need for follow-up.  Problem List Items Addressed This Visit       Other   Class 1 obesity due to excess calories without serious comorbidity with body mass index (BMI) of 33.0 to 33.9 in adult - Primary    Diet and nutrition discussed Benefits of exercise discussed Advised to decrease amount of daily carbohydrate intake and daily calories and increase amount of plant-based protein in her diet Need for blood work today      Relevant Orders   CBC with Differential/Platelet   Comprehensive metabolic panel   Hemoglobin A1c   TSH   Lipid panel   History of herpes simplex infection   Relevant Medications   valACYclovir (VALTREX) 500 MG tablet   Patient Instructions  Health Maintenance, Female Adopting a healthy lifestyle and getting preventive care are important in promoting health and wellness. Ask your health care provider about: The right schedule for you to have regular tests and exams. Things you can do on your own to prevent diseases and keep yourself healthy. What should I know about diet, weight, and exercise? Eat a healthy diet  Eat a diet that includes plenty of vegetables, fruits, low-fat dairy products, and lean protein. Do not eat a lot of foods that are high in solid fats, added sugars, or sodium. Maintain a healthy weight Body mass index (BMI) is used to identify weight problems. It estimates body fat based on height and weight. Your health care provider can help determine your BMI and help you achieve or maintain a healthy weight. Get regular exercise Get regular exercise. This is one of the most important things you can do for your health. Most adults should: Exercise for at least 150 minutes each week. The exercise should increase your heart rate and make you sweat (moderate-intensity exercise). Do strengthening exercises at least twice a week.  This is in addition to the moderate-intensity exercise. Spend less time sitting. Even light physical activity can be beneficial. Watch cholesterol and blood lipids Have your blood tested for  lipids and cholesterol at 42 years of age, then have this test every 5 years. Have your cholesterol levels checked more often if: Your lipid or cholesterol levels are high. You are older than 42 years of age. You are at high risk for heart disease. What should I know about cancer screening? Depending on your health history and family history, you may need to have cancer screening at various ages. This may include screening for: Breast cancer. Cervical cancer. Colorectal cancer. Skin cancer. Lung cancer. What should I know about heart disease, diabetes, and high blood pressure? Blood pressure and heart disease High blood pressure causes heart disease and increases the risk of stroke. This is more likely to develop in people who have high blood pressure readings or are overweight. Have your blood pressure checked: Every 3-5 years if you are 61-19 years of age. Every year if you are 33 years old or older. Diabetes Have regular diabetes screenings. This checks your fasting blood sugar level. Have the screening done: Once every three years after age 62 if you are at a normal weight and have a low risk for diabetes. More often and at a younger age if you are overweight or have a high risk for diabetes. What should I know about preventing infection? Hepatitis B If you have a higher risk for hepatitis B, you should be screened for this virus. Talk with your health care provider to find out if you are at risk for hepatitis B infection. Hepatitis C Testing is recommended for: Everyone born from [birth year] through 1965. Anyone with known risk factors for hepatitis C. Sexually transmitted infections (STIs) Get screened for STIs, including gonorrhea and chlamydia, if: You are sexually active and are younger than  42 years of age. You are older than 42 years of age and your health care provider tells you that you are at risk for this type of infection. Your sexual activity has changed since you were last screened, and you are at increased risk for chlamydia or gonorrhea. Ask your health care provider if you are at risk. Ask your health care provider about whether you are at high risk for HIV. Your health care provider may recommend a prescription medicine to help prevent HIV infection. If you choose to take medicine to prevent HIV, you should first get tested for HIV. You should then be tested every 3 months for as long as you are taking the medicine. Pregnancy If you are about to stop having your period (premenopausal) and you may become pregnant, seek counseling before you get pregnant. Take 400 to 800 micrograms (mcg) of folic acid every day if you become pregnant. Ask for birth control (contraception) if you want to prevent pregnancy. Osteoporosis and menopause Osteoporosis is a disease in which the bones lose minerals and strength with aging. This can result in bone fractures. If you are 58 years old or older, or if you are at risk for osteoporosis and fractures, ask your health care provider if you should: Be screened for bone loss. Take a calcium or vitamin D supplement to lower your risk of fractures. Be given hormone replacement therapy (HRT) to treat symptoms of menopause. Follow these instructions at home: Alcohol use Do not drink alcohol if: Your health care provider tells you not to drink. You are pregnant, may be pregnant, or are planning to become pregnant. If you drink alcohol: Limit how much you have to: 0-1 drink a day. Know how much alcohol is in your drink. In the  [place] one drink equals one 12 oz bottle of beer (355 mL), one 5 oz glass of wine (148 mL), or one 1 oz glass of hard liquor (44 mL). Lifestyle Do not use any products that contain nicotine or tobacco. These products  include cigarettes, chewing tobacco, and vaping devices, such as e-cigarettes. If you need help quitting, ask your health care provider. Do not use street drugs. Do not share needles. Ask your health care provider for help if you need support or information about quitting drugs. General instructions Schedule regular health, dental, and eye exams. Stay current with your vaccines. Tell your health care provider if: You often feel depressed. You have ever been abused or do not feel safe at home. Summary Adopting a healthy lifestyle and getting preventive care are important in promoting health and wellness. Follow your health care provider's instructions about healthy diet, exercising, and getting tested or screened for diseases. Follow your health care provider's instructions on monitoring your cholesterol and blood pressure. This information is not intended to replace advice given to you by your health care provider. Make sure you discuss any questions you have with your health care provider. Document Revised: 08/05/2020 Document Reviewed: 08/05/2020 Elsevier Patient Education  2024 Elsevier Inc.      Edwina Barth, MD [facility]

## 2023-01-11 NOTE — Assessment & Plan Note (Signed)
Diet and nutrition discussed Benefits of exercise discussed Advised to decrease amount of daily carbohydrate intake and daily calories and increase amount of plant-based protein in her diet Need for blood work today

## 2023-01-11 NOTE — Patient Instructions (Signed)
